# Patient Record
Sex: Female | Born: 1955 | Race: White | Hispanic: No | Marital: Married | Smoking: Never smoker
Health system: Southern US, Community
[De-identification: ages and names within clinical notes are randomized; demographics above are authoritative.]

## PROBLEM LIST (undated history)

## (undated) DIAGNOSIS — G4700 Insomnia, unspecified: Secondary | ICD-10-CM

## (undated) DIAGNOSIS — E785 Hyperlipidemia, unspecified: Secondary | ICD-10-CM

## (undated) DIAGNOSIS — K219 Gastro-esophageal reflux disease without esophagitis: Secondary | ICD-10-CM

## (undated) DIAGNOSIS — R7303 Prediabetes: Secondary | ICD-10-CM

## (undated) HISTORY — DX: Insomnia, unspecified: G47.00

## (undated) HISTORY — DX: Gastro-esophageal reflux disease without esophagitis: K21.9

## (undated) HISTORY — DX: Hyperlipidemia, unspecified: E78.5

## (undated) HISTORY — DX: Prediabetes: R73.03

---

## 2002-05-29 ENCOUNTER — Other Ambulatory Visit: Admission: RE | Admit: 2002-05-29 | Discharge: 2002-05-29 | Payer: Self-pay | Admitting: Gynecology

## 2003-08-06 ENCOUNTER — Other Ambulatory Visit: Admission: RE | Admit: 2003-08-06 | Discharge: 2003-08-06 | Payer: Self-pay | Admitting: Gynecology

## 2004-06-20 ENCOUNTER — Emergency Department (HOSPITAL_COMMUNITY): Admission: EM | Admit: 2004-06-20 | Discharge: 2004-06-20 | Payer: Self-pay | Admitting: Emergency Medicine

## 2004-08-06 ENCOUNTER — Other Ambulatory Visit: Admission: RE | Admit: 2004-08-06 | Discharge: 2004-08-06 | Payer: Self-pay | Admitting: Gynecology

## 2005-08-26 ENCOUNTER — Other Ambulatory Visit: Admission: RE | Admit: 2005-08-26 | Discharge: 2005-08-26 | Payer: Self-pay | Admitting: Gynecology

## 2007-02-08 ENCOUNTER — Ambulatory Visit: Payer: Self-pay | Admitting: Family Medicine

## 2011-10-25 ENCOUNTER — Ambulatory Visit: Payer: Self-pay | Admitting: Family Medicine

## 2014-11-08 LAB — FECAL OCCULT BLOOD, GUAIAC: FECAL OCCULT BLD: NEGATIVE

## 2016-05-03 ENCOUNTER — Ambulatory Visit: Payer: Self-pay | Admitting: Primary Care

## 2016-12-23 ENCOUNTER — Encounter: Payer: Self-pay | Admitting: Primary Care

## 2016-12-23 ENCOUNTER — Ambulatory Visit (INDEPENDENT_AMBULATORY_CARE_PROVIDER_SITE_OTHER): Payer: 59 | Admitting: Primary Care

## 2016-12-23 VITALS — BP 118/86 | HR 70 | Ht 64.5 in | Wt 150.5 lb

## 2016-12-23 DIAGNOSIS — E785 Hyperlipidemia, unspecified: Secondary | ICD-10-CM | POA: Diagnosis not present

## 2016-12-23 DIAGNOSIS — K219 Gastro-esophageal reflux disease without esophagitis: Secondary | ICD-10-CM | POA: Diagnosis not present

## 2016-12-23 DIAGNOSIS — G4700 Insomnia, unspecified: Secondary | ICD-10-CM | POA: Diagnosis not present

## 2016-12-23 MED ORDER — TRAZODONE HCL 50 MG PO TABS: ORAL_TABLET | ORAL | 0 refills | Status: DC

## 2016-12-23 NOTE — Patient Instructions (Addendum)
Try switching the omeprazole for ranitidine 150 mg. Take 1 tablet by mouth once daily. If no improvement, then you may take 150 mg twice daily.   If you experience a return of your symptoms on the ranitidine then resume the omeprazole.  Try trazodone 50 mg tablets for insomnia. Take 1-2 tablets by mouth at bedtime as needed for sleep. Please call me if you don't notice any improvement. I would like for you to stop taking Lorazepam if possible.  Complete lab work prior to leaving today. I will notify you of your results once received.   Start exercising. You should be getting 150 minutes of moderate intensity exercise weekly.  It was a pleasure to meet you today! Please don't hesitate to call me with any questions. Welcome to [facility]!   High Cholesterol High cholesterol is a condition in which the blood has high levels of a white, waxy, fat-like substance (cholesterol). The human body needs small amounts of cholesterol. The liver makes all the cholesterol that the body needs. Extra (excess) cholesterol comes from the food that we eat. Cholesterol is carried from the liver by the blood through the blood vessels. If you have high cholesterol, deposits (plaques) may build up on the walls of your blood vessels (arteries). Plaques make the arteries narrower and stiffer. Cholesterol plaques increase your risk for heart attack and stroke. Work with your health care provider to keep your cholesterol levels in a healthy range. What increases the risk? This condition is more likely to develop in people who:  Eat foods that are high in animal fat (saturated fat) or cholesterol.  Are overweight.  Are not getting enough exercise.  Have a family history of high cholesterol. What are the signs or symptoms? There are no symptoms of this condition. How is this diagnosed? This condition may be diagnosed from the results of a blood test.  If you are older than age 40, your health care provider may check  your cholesterol every 4-6 years.  You may be checked more often if you already have high cholesterol or other risk factors for heart disease. The blood test for cholesterol measures:  "Bad" cholesterol (LDL cholesterol). This is the main type of cholesterol that causes heart disease. The desired level for LDL is less than 100.  "Good" cholesterol (HDL cholesterol). This type helps to protect against heart disease by cleaning the arteries and carrying the LDL away. The desired level for HDL is 60 or higher.  Triglycerides. These are fats that the body can store or burn for energy. The desired number for triglycerides is lower than 150.  Total cholesterol. This is a measure of the total amount of cholesterol in your blood, including LDL cholesterol, HDL cholesterol, and triglycerides. A healthy number is less than 200. How is this treated? This condition is treated with diet changes, lifestyle changes, and medicines. Diet changes  This may include eating more whole grains, fruits, vegetables, nuts, and fish.  This may also include cutting back on red meat and foods that have a lot of added sugar. Lifestyle changes  Changes may include getting at least 40 minutes of aerobic exercise 3 times a week. Aerobic exercises include walking, biking, and swimming. Aerobic exercise along with a healthy diet can help you maintain a healthy weight.  Changes may also include quitting smoking. Medicines  Medicines are usually given if diet and lifestyle changes have failed to reduce your cholesterol to healthy levels.  Your health care provider may prescribe a  statin medicine. Statin medicines have been shown to reduce cholesterol, which can reduce the risk of heart disease. Follow these instructions at home: Eating and drinking If told by your health care provider:  Eat chicken (without skin), fish, veal, shellfish, ground [place]aw[place]urkey breast, and round or loin cuts of red meat.  Do not eat fried foods  or fatty meats, such as hot dogs and salami.  Eat plenty of fruits, such as apples.  Eat plenty of vegetables, such as broccoli, potatoes, and carrots.  Eat beans, peas, and lentils.  Eat grains such as barley, rice, couscous, and bulgur wheat.  Eat pasta without cream sauces.  Use skim or nonfat milk, and eat low-fat or nonfat yogurt and cheeses.  Do not eat or drink whole milk, cream, ice cream, egg yolks, or hard cheeses.  Do not eat stick margarine or tub margarines that contain trans fats (also called partially hydrogenated oils).  Do not eat saturated tropical oils, such as coconut oil and palm oil.  Do not eat cakes, cookies, crackers, or other baked goods that contain trans fats. General instructions  Exercise as directed by your health care provider. Increase your activity level with activities such as gardening, walking, and taking the stairs.  Take over-the-counter and prescription medicines only as told by your health care provider.  Do not use any products that contain nicotine or tobacco, such as cigarettes and e-cigarettes. If you need help quitting, ask your health care provider.  Keep all follow-up visits as told by your health care provider. This is important. Contact a health care provider if:  You are struggling to maintain a healthy diet or weight.  You need help to start on an exercise program.  You need help to stop smoking. Get help right away if:  You have chest pain.  You have trouble breathing. This information is not intended to replace advice given to you by your health care provider. Make sure you discuss any questions you have with your health care provider. Document Released: 10/25/2005 Document Revised: 05/22/2016 Document Reviewed: 04/24/2016 Elsevier Interactive Patient Education  2017 [employer]

## 2016-12-23 NOTE — Assessment & Plan Note (Signed)
Repeat lipids today. Discussed the importance of a healthy diet and regular exercise in order for weight loss, and to reduce the risk of other medical diseases.

## 2016-12-23 NOTE — Assessment & Plan Note (Signed)
Discussed long term effects of PPI, will have her trial ranitidine 150 mg daily, BID if no improvement. She will notify if symptoms return on H2 Blocker.

## 2016-12-23 NOTE — Progress Notes (Signed)
   Subjective:    Patient ID: Carolyn Cooke, female    DOB: Oct 25, 1956, 61 y.o.   MRN: [id number]  HPI  Ms. Carolyn Cooke is a 61 year old female who presents today to establish care and discuss the problems mentioned below. Will obtain old records.  1) Generalized Anxiety Disorder: Diagnosed several years ago. Currently managed on Fluoxetine 20 mg and lorazepam 1 mg. She takes the lorazepam 1/2 tablet at bedtime everynight to help fall asleep. She's been taking lorazepam at bedtime for the last several years. She's tried tylenol PM, Melatonin without improvement in sleep.  2) GERD: Experiences symptoms of food getting stuck in her esophagus with epigastric pressure. Currently managed on omeprazole 20 mg daily and is without symptoms. She's never tired H2 Blockers and has been on omeprazole for years.   3) Hyperlipidemia: Lipid panel in August 2017 with TC of 231, LDL of 164, Trigs of 159. This was drawn by her occupation in August 2017. Currently managed on Plant Sterols for which she's taken since her labs were drawn. She does not regularly exercise.   Review of Systems  Respiratory: Negative for shortness of breath.   Cardiovascular: Negative for chest pain.  Gastrointestinal:       GERD symptoms improved with PPI  Psychiatric/Behavioral: Positive for sleep disturbance. The patient is not nervous/anxious.        No past medical history on file.   Social History   Social History  . Marital status: Married    Spouse name: N/A  . Number of children: N/A  . Years of education: N/A   Occupational History  . Not on file.   Social History Main Topics  . Smoking status: Never Smoker  . Smokeless tobacco: Never Used  . Alcohol use Not on file  . Drug use: Unknown  . Sexual activity: Not on file   Other Topics Concern  . Not on file   Social History Narrative  . No narrative on file    No past surgical history on file.  Family History  Problem Relation Age of Onset  .  Arthritis Mother   . Hypercholesterolemia Mother   . Hypertension Mother   . Hypertension Father     No Known Allergies  No current outpatient prescriptions on file prior to visit.   No current facility-administered medications on file prior to visit.     BP 118/86 (BP Location: Left Arm, Patient Position: Sitting, Cuff Size: Normal)   Pulse 70   Ht 5' 4.5" (1.638 m)   Wt 150 lb 8 oz (68.3 kg)   SpO2 96%   BMI 25.43 kg/m    Objective:   Physical Exam  Constitutional: She appears well-nourished.  Neck: Neck supple.  Cardiovascular: Normal rate and regular rhythm.   Pulmonary/Chest: Effort normal and breath sounds normal.  Skin: Skin is warm and dry.  Psychiatric: She has a normal mood and affect.          Assessment & Plan:

## 2016-12-23 NOTE — Progress Notes (Signed)
Pre visit review using our clinic review tool, if applicable. No additional management support is needed unless otherwise documented below in the visit note. 

## 2016-12-23 NOTE — Assessment & Plan Note (Signed)
Discouraged use of Benzo's for insomnia and anxiety, she is open to discontinuing.  Continue Prozac. Will trial Trazodone for insomnia. She will update if no improvement.

## 2016-12-24 LAB — LIPID PANEL
CHOLESTEROL TOTAL: 243 mg/dL — AB (ref 100–199)
Chol/HDL Ratio: 7.4 ratio units — ABNORMAL HIGH (ref 0.0–4.4)
HDL: 33 mg/dL — ABNORMAL LOW (ref >39)
LDL Calculated: 160 mg/dL — ABNORMAL HIGH (ref 0–99)
Triglycerides: 251 mg/dL — ABNORMAL HIGH (ref 0–149)
VLDL CHOLESTEROL CAL: 50 mg/dL — AB (ref 5–40)

## 2016-12-29 ENCOUNTER — Encounter: Payer: Self-pay | Admitting: *Deleted

## 2017-04-07 LAB — HM PAP SMEAR: HM PAP: NEGATIVE

## 2018-04-13 ENCOUNTER — Encounter: Payer: Self-pay | Admitting: Primary Care

## 2018-07-12 ENCOUNTER — Ambulatory Visit: Payer: Managed Care, Other (non HMO) | Admitting: Primary Care

## 2018-07-12 ENCOUNTER — Encounter: Payer: Self-pay | Admitting: Primary Care

## 2018-07-12 DIAGNOSIS — E785 Hyperlipidemia, unspecified: Secondary | ICD-10-CM

## 2018-07-12 DIAGNOSIS — R7303 Prediabetes: Secondary | ICD-10-CM

## 2018-07-12 MED ORDER — ROSUVASTATIN CALCIUM 5 MG PO TABS: 5.0000 mg | ORAL_TABLET | Freq: Every day | ORAL | 3 refills | Status: DC

## 2018-07-12 NOTE — Progress Notes (Signed)
Subjective:    Patient ID: Carolyn Cooke, female    DOB: Sep 23, 1956, 62 y.o.   MRN: [id number]  HPI  Carolyn Cooke is a 62 year old female with a history of hyperlipidemia managed on plant sterols who presents today to discuss recent cholesterol levels.  Her last lipid panel in our office was in February 2018 with TC of 243, HDL of 33, LDl of 160, Trigs of 251. She recent underwent a health screening at work which showed TC of 254, Trigs 236, HDL of 32, LDl of 175.  She is currently taking on Cholestol OTC twice daily.   Diet currently consists of:  Breakfast: Skips, cereal  Lunch: Salad, pack of crackers Dinner: Meat, vegetable, starch Snacks: Popcorn, crackers, veggies straws Desserts: 3-5 times weekly  Beverages: Water, little soda, lemonade  Exercise: She is not exercising  The 10-year ASCVD risk score Denman George DC Jr., et al., 2013) is: 6.8%   Values used to calculate the score:     Age: 80 years     Sex: Female     Is Non-Hispanic African American: No     Diabetic: No     Tobacco smoker: No     Systolic Blood Pressure: 130 mmHg     Is BP treated: No     HDL Cholesterol: 33 mg/dL     Total Cholesterol: 243 mg/dL   Review of Systems  Respiratory: Negative for shortness of breath.   Cardiovascular: Negative for chest pain.  Neurological: Negative for dizziness and headaches.       No past medical history on file.   Social History   Socioeconomic History  . Marital status: Married    Spouse name: Not on file  . Number of children: Not on file  . Years of education: Not on file  . Highest education level: Not on file  Occupational History  . Not on file  Social Needs  . Financial resource strain: Not on file  . Food insecurity:    Worry: Not on file    Inability: Not on file  . Transportation needs:    Medical: Not on file    Non-medical: Not on file  Tobacco Use  . Smoking status: Never Smoker  . Smokeless tobacco: Never Used  Substance and Sexual  Activity  . Alcohol use: Not on file  . Drug use: Not on file  . Sexual activity: Not on file  Lifestyle  . Physical activity:    Days per week: Not on file    Minutes per session: Not on file  . Stress: Not on file  Relationships  . Social connections:    Talks on phone: Not on file    Gets together: Not on file    Attends religious service: Not on file    Active member of club or organization: Not on file    Attends meetings of clubs or organizations: Not on file    Relationship status: Not on file  . Intimate partner violence:    Fear of current or ex partner: Not on file    Emotionally abused: Not on file    Physically abused: Not on file    Forced sexual activity: Not on file  Other Topics Concern  . Not on file  Social History Narrative  . Not on file     Family History  Problem Relation Age of Onset  . Arthritis Mother   . Hypercholesterolemia Mother   . Hypertension Mother   .  Hypertension Father     Allergies  Allergen Reactions  . Cephalexin   . Morphine   . Prochlorperazine Edisylate     Current Outpatient Medications on File Prior to Visit  Medication Sig Dispense Refill  . FLUoxetine (PROZAC) 20 MG capsule Take 20 mg by mouth daily.    Marland Kitchen omeprazole (PRILOSEC) 20 MG capsule Take 20 mg by mouth daily.    . Plant Sterols and Stanols (CHOLESTOFF PO) Take by mouth.    . traZODone (DESYREL) 50 MG tablet Take 1-2 tablets by mouth at bedtime for insomnia. (Patient not taking: Reported on 07/12/2018) 30 tablet 0   No current facility-administered medications on file prior to visit.     BP 130/80   Pulse 63   Temp 98.1 F (36.7 C) (Oral)   Ht 5' 4.5" (1.638 m)   Wt 152 lb 12 oz (69.3 kg)   SpO2 98%   BMI 25.81 kg/m    Objective:   Physical Exam  Constitutional: She appears well-nourished.  Neck: Neck supple.  Cardiovascular: Normal rate and regular rhythm.  Respiratory: Effort normal and breath sounds normal.  Skin: Skin is warm and dry.            Assessment & Plan:

## 2018-07-12 NOTE — Assessment & Plan Note (Signed)
Strong family history of hyperlipidemia, CAD, stroke. She has a strong personal history hyperlipidemia that has gradually increased over time.   Long discussion regarding treatment, agree that low dose statin is appropriate given family history and gradual increase in lipids.   Rx for Crestor 5 mg. Repeat lipids and LFT's in 6 weeks

## 2018-07-12 NOTE — Assessment & Plan Note (Signed)
Recent A1C of 6.2, has been the same since 2017, A1C of 6.4 in 2016. Will have her work on lifestyle changes. Continue to monitor.

## 2018-07-12 NOTE — Patient Instructions (Signed)
Start exercising. You should be getting 150 minutes of moderate intensity exercise weekly.  Continue to work on [employer].  Ensure you are consuming 64 ounces of water daily.  Schedule a lab only appointment for 7 weeks out. Make sure to come fasting.   It was a pleasure to see you today!

## 2018-07-24 ENCOUNTER — Telehealth: Payer: Self-pay | Admitting: Primary Care

## 2018-07-24 DIAGNOSIS — E785 Hyperlipidemia, unspecified: Secondary | ICD-10-CM

## 2018-07-24 MED ORDER — ROSUVASTATIN CALCIUM 5 MG PO TABS: 5.0000 mg | ORAL_TABLET | Freq: Every day | ORAL | 0 refills | Status: DC

## 2018-07-24 NOTE — Telephone Encounter (Signed)
Spoken to patient. Will send patient 30 days supply to [facility] as requested. Will also re-sent Rx to [facility] and call as well.

## 2018-07-24 NOTE — Telephone Encounter (Signed)
Copied from CRM [id number]. Topic: Quick Communication - Rx Refill/Question >> Jul 24, 2018 12:14 PM Luanna ColeDawoud, Jessica L wrote: Medication:  rosuvastatin (CRESTOR) 5 MG tablet [[id number]]  pt has been waiting since 07/12/18. [facility] states that medication was never sent in. Pt would like this called to a local [facility]. Please advise  Has the patient contacted their pharmacy? yes Preferred Pharmacy (with phone number or street name): [facility] [address] - [place], [place] - [id number] [phone] (Phone) [phone] (Fax)   Agent: Please be advised that RX refills may take up to 3 business days. We ask that you follow-up with your pharmacy.

## 2018-07-28 MED ORDER — ROSUVASTATIN CALCIUM 5 MG PO TABS: 5.0000 mg | ORAL_TABLET | Freq: Every day | ORAL | 1 refills | Status: DC

## 2018-08-18 ENCOUNTER — Other Ambulatory Visit: Payer: Self-pay | Admitting: Primary Care

## 2018-08-18 DIAGNOSIS — E785 Hyperlipidemia, unspecified: Secondary | ICD-10-CM

## 2018-08-18 MED ORDER — ROSUVASTATIN CALCIUM 5 MG PO TABS: 5.0000 mg | ORAL_TABLET | Freq: Every day | ORAL | 1 refills | Status: DC

## 2018-08-18 NOTE — Telephone Encounter (Signed)
Lab result from her job faxed in and those results in chart.  Requested Prescriptions  Pending Prescriptions Disp Refills  . rosuvastatin (CRESTOR) 5 MG tablet 90 tablet 1    Sig: Take 1 tablet (5 mg total) by mouth daily.     Cardiovascular:  Antilipid - Statins Failed - 08/18/2018 12:36 PM      Failed - Total Cholesterol in normal range and within 360 days    Cholesterol, Total  Date Value Ref Range Status  12/23/2016 243 (H) 100 - 199 mg/dL Final         Failed - LDL in normal range and within 360 days    LDL Calculated  Date Value Ref Range Status  12/23/2016 160 (H) 0 - 99 mg/dL Final         Failed - HDL in normal range and within 360 days    HDL  Date Value Ref Range Status  12/23/2016 33 (L) >39 mg/dL Final         Failed - Triglycerides in normal range and within 360 days    Triglycerides  Date Value Ref Range Status  12/23/2016 251 (H) 0 - 149 mg/dL Final         Passed - Patient is not pregnant      Passed - Valid encounter within last 12 months    Recent Outpatient Visits          1 month ago Prediabetes   Nature conservation officer at [facility], Keane Scrape, NP   1 year ago Insomnia, unspecified type   Nature conservation officer at [facility], Keane Scrape, NP

## 2018-08-18 NOTE — Telephone Encounter (Signed)
[facility] called and spoke to [place], Pharmacy Help Desk who says the patient is not in the system.

## 2018-08-18 NOTE — Telephone Encounter (Signed)
Copied from CRM [id number]. Topic: Quick Communication - Rx Refill/Question >> Aug 18, 2018 11:07 AM Lorrine Kin, NT wrote: **Patient states that she needs a call today! States that she spoke with someone ealier this week about [facility] needing a phone call regarding the prescription. Patient states that [facility] has not received this medication. [facility] is needing a phone call [phone]).  Patient has 4 pills left. Would like a 90 day supply sent to [facility] at [facility] and [address] **  Medication: rosuvastatin (CRESTOR) 5 MG tablet   Has the patient contacted their pharmacy? Yes.   (Agent: If no, request that the patient contact the pharmacy for the refill.) (Agent: If yes, when and what did the pharmacy advise?)  Preferred Pharmacy (with phone number or street name): [id number] - [place], [place] - [address]  Agent: Please be advised that RX refills may take up to 3 business days. We ask that you follow-up with your pharmacy.

## 2018-08-29 ENCOUNTER — Telehealth: Payer: Self-pay

## 2018-08-29 DIAGNOSIS — E785 Hyperlipidemia, unspecified: Secondary | ICD-10-CM

## 2018-08-29 NOTE — Telephone Encounter (Signed)
Patient notified. Orders placed. °

## 2018-08-29 NOTE — Telephone Encounter (Signed)
Copied from CRM [id number]. Topic: General - Other >> Aug 29, 2018 10:45 AM Gaynelle Adu wrote: Reason for CRM: Patient is requesting to cancel  her lab appt,  she is wanting to have her lab order sent over to [facility]. Please advise

## 2018-08-30 ENCOUNTER — Other Ambulatory Visit: Payer: Managed Care, Other (non HMO)

## 2018-09-04 ENCOUNTER — Telehealth: Payer: Self-pay | Admitting: Primary Care

## 2018-09-04 DIAGNOSIS — E785 Hyperlipidemia, unspecified: Secondary | ICD-10-CM

## 2018-09-04 NOTE — Telephone Encounter (Signed)
Patient is due for repeat labs since starting rosuvastatin for cholesterol. Please schedule a lab only appointment. Needs to be fasting 4 hours.

## 2018-09-05 NOTE — Telephone Encounter (Signed)
Please notify patient that her labs have been sent to [facility]. Make sure she is fasting 4 hours prior to labs. Water and black coffee only.

## 2018-09-05 NOTE — Telephone Encounter (Signed)
Pt states she is wanting to do labs at [facility]

## 2018-09-05 NOTE — Telephone Encounter (Signed)
Spoke to pt

## 2018-09-07 LAB — HEPATIC FUNCTION PANEL
ALT: 15 IU/L (ref 0–32)
AST: 16 IU/L (ref 0–40)
Albumin: 4.2 g/dL (ref 3.6–4.8)
Alkaline Phosphatase: 92 IU/L (ref 39–117)
Bilirubin Total: 0.2 mg/dL (ref 0.0–1.2)
Bilirubin, Direct: 0.07 mg/dL (ref 0.00–0.40)
TOTAL PROTEIN: 6.4 g/dL (ref 6.0–8.5)

## 2018-09-07 LAB — LIPID PANEL
CHOL/HDL RATIO: 5.2 ratio — AB (ref 0.0–4.4)
Cholesterol, Total: 160 mg/dL (ref 100–199)
HDL: 31 mg/dL — AB (ref >39)
LDL CALC: 90 mg/dL (ref 0–99)
Triglycerides: 197 mg/dL — ABNORMAL HIGH (ref 0–149)
VLDL CHOLESTEROL CAL: 39 mg/dL (ref 5–40)

## 2019-02-09 ENCOUNTER — Other Ambulatory Visit: Payer: Self-pay | Admitting: Primary Care

## 2019-02-09 DIAGNOSIS — E785 Hyperlipidemia, unspecified: Secondary | ICD-10-CM

## 2019-06-09 LAB — COLOGUARD: Cologuard: NEGATIVE

## 2019-08-09 ENCOUNTER — Other Ambulatory Visit: Payer: Self-pay | Admitting: Primary Care

## 2019-08-09 DIAGNOSIS — E785 Hyperlipidemia, unspecified: Secondary | ICD-10-CM

## 2019-08-21 DIAGNOSIS — R7303 Prediabetes: Secondary | ICD-10-CM

## 2019-08-21 DIAGNOSIS — E785 Hyperlipidemia, unspecified: Secondary | ICD-10-CM

## 2019-08-21 DIAGNOSIS — Z1159 Encounter for screening for other viral diseases: Secondary | ICD-10-CM

## 2019-09-03 ENCOUNTER — Other Ambulatory Visit: Payer: Self-pay | Admitting: Primary Care

## 2019-09-04 LAB — COMPREHENSIVE METABOLIC PANEL
ALT: 13 IU/L (ref 0–32)
AST: 16 IU/L (ref 0–40)
Albumin/Globulin Ratio: 1.8 (ref 1.2–2.2)
Albumin: 4.2 g/dL (ref 3.8–4.8)
Alkaline Phosphatase: 110 IU/L (ref 39–117)
BUN/Creatinine Ratio: 15 (ref 12–28)
BUN: 12 mg/dL (ref 8–27)
Bilirubin Total: 0.3 mg/dL (ref 0.0–1.2)
CO2: 24 mmol/L (ref 20–29)
Calcium: 9.4 mg/dL (ref 8.7–10.3)
Chloride: 106 mmol/L (ref 96–106)
Creatinine, Ser: 0.8 mg/dL (ref 0.57–1.00)
GFR calc Af Amer: 90 mL/min/{1.73_m2} (ref >59)
GFR calc non Af Amer: 78 mL/min/{1.73_m2} (ref >59)
Globulin, Total: 2.3 g/dL (ref 1.5–4.5)
Glucose: 100 mg/dL — ABNORMAL HIGH (ref 65–99)
Potassium: 4.7 mmol/L (ref 3.5–5.2)
Sodium: 143 mmol/L (ref 134–144)
Total Protein: 6.5 g/dL (ref 6.0–8.5)

## 2019-09-04 LAB — LIPID PANEL W/O CHOL/HDL RATIO
Cholesterol, Total: 172 mg/dL (ref 100–199)
HDL: 36 mg/dL — ABNORMAL LOW (ref >39)
LDL Chol Calc (NIH): 107 mg/dL — ABNORMAL HIGH (ref 0–99)
Triglycerides: 161 mg/dL — ABNORMAL HIGH (ref 0–149)
VLDL Cholesterol Cal: 29 mg/dL (ref 5–40)

## 2019-09-04 LAB — CBC
Hematocrit: 38.1 % (ref 34.0–46.6)
Hemoglobin: 12.9 g/dL (ref 11.1–15.9)
MCH: 27.8 pg (ref 26.6–33.0)
MCHC: 33.9 g/dL (ref 31.5–35.7)
MCV: 82 fL (ref 79–97)
Platelets: 342 10*3/uL (ref 150–450)
RBC: 4.64 x10E6/uL (ref 3.77–5.28)
RDW: 13 % (ref 11.7–15.4)
WBC: 6.9 10*3/uL (ref 3.4–10.8)

## 2019-09-04 LAB — HEPATITIS C ANTIBODY: Hep C Virus Ab: <0.1 s/co ratio (ref 0.0–0.9)

## 2019-09-04 LAB — HEMOGLOBIN A1C
Est. average glucose Bld gHb Est-mCnc: 126 mg/dL
Hgb A1c MFr Bld: 6 % — ABNORMAL HIGH (ref 4.8–5.6)

## 2019-09-10 ENCOUNTER — Other Ambulatory Visit: Payer: Self-pay

## 2019-09-10 ENCOUNTER — Encounter: Payer: Self-pay | Admitting: Primary Care

## 2019-09-10 ENCOUNTER — Ambulatory Visit: Payer: Managed Care, Other (non HMO) | Admitting: Primary Care

## 2019-09-10 VITALS — BP 126/86 | HR 78 | Temp 97.9°F | Ht 64.5 in | Wt 154.5 lb

## 2019-09-10 DIAGNOSIS — R7303 Prediabetes: Secondary | ICD-10-CM

## 2019-09-10 DIAGNOSIS — K219 Gastro-esophageal reflux disease without esophagitis: Secondary | ICD-10-CM

## 2019-09-10 DIAGNOSIS — E785 Hyperlipidemia, unspecified: Secondary | ICD-10-CM | POA: Diagnosis not present

## 2019-09-10 DIAGNOSIS — G4700 Insomnia, unspecified: Secondary | ICD-10-CM | POA: Diagnosis not present

## 2019-09-10 DIAGNOSIS — Z23 Encounter for immunization: Secondary | ICD-10-CM | POA: Diagnosis not present

## 2019-09-10 MED ORDER — TRAZODONE HCL 50 MG PO TABS: ORAL_TABLET | ORAL | 0 refills | Status: DC

## 2019-09-10 NOTE — Patient Instructions (Addendum)
You can try the trazodone tablets as needed for sleep.   Have the pharmacy notify me when you need refills of the cholesterol medication.  Start exercising. You should be getting 150 minutes of moderate intensity exercise weekly.  Be sure to eat a healthy diet. Ensure you are consuming 64 ounces of water daily.  Schedule a nurse visit for 2-6 months from today for the second shingles vaccination.  It was a pleasure to see you today!

## 2019-09-10 NOTE — Progress Notes (Signed)
Subjective:    Patient ID: Carolyn Cooke, female    DOB: 1956/02/13, 63 y.o.   MRN: [id number]  HPI  Ms. Carolyn Cooke is a 63 year old female who presents today for follow up. She follows with her GYN for annual exams and mammograms. She would like the Shingrix vaccination.  1) Hyperlipidemia: Currently managed on rosuvastatin 5 mg for which she's compliant to daily. She admits to a poor diet and lack of regular exercise over the last year. Recent LDL of 107.  The 10-year ASCVD risk score Mikey Bussing DC Brooke Bonito., et al., 2013) is: 5.4%   Values used to calculate the score:     Age: 46 years     Sex: Female     Is Non-Hispanic African American: No     Diabetic: No     Tobacco smoker: No     Systolic Blood Pressure: 938 mmHg     Is BP treated: No     HDL Cholesterol: 36 mg/dL     Total Cholesterol: 172 mg/dL  2) Insomnia/Anxiety/Depression: Currently managed on fluoxetine 20 mg for which she taking daily for anxiety/depression. 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 She is taking Melatonin at night which does well for the most part, but does notice a few nights having difficulty falling asleep. She as once managed on Trazodone in the past and did well, would like a refill.  BP Readings from Last 3 Encounters:  09/10/19 126/86  07/12/18 130/80  12/23/16 118/86      Review of Systems  Eyes: Negative for visual disturbance.  Respiratory: Negative for shortness of breath.   Cardiovascular: Negative for chest pain.  Neurological: Negative for dizziness.  Psychiatric/Behavioral:       See HPI.       Past Medical History:  Diagnosis Date  . Hyperlipidemia   . Prediabetes      Social History   Socioeconomic History  . Marital status: Married    Spouse name: Not on file  . Number of children: Not on file  . Years of education: Not on file  . Highest education level: Not on file  Occupational History  . Not on file  Social Needs   . Financial resource strain: Not on file  . Food insecurity    Worry: Not on file    Inability: Not on file  . Transportation needs    Medical: Not on file    Non-medical: Not on file  Tobacco Use  . Smoking status: Never Smoker  . Smokeless tobacco: Never Used  Substance and Sexual Activity  . Alcohol use: Not on file  . Drug use: Not on file  . Sexual activity: Not on file  Lifestyle  . Physical activity    Days per week: Not on file    Minutes per session: Not on file  . Stress: Not on file  Relationships  . Social Herbalist on phone: Not on file    Gets together: Not on file    Attends religious service: Not on file    Active member of club or organization: Not on file    Attends meetings of clubs or organizations: Not on file    Relationship status: Not on file  . Intimate partner violence    Fear of current or ex partner: Not on file    Emotionally abused: Not on file    Physically abused: Not on  file    Forced sexual activity: Not on file  Other Topics Concern  . Not on file  Social History Narrative  . Not on file    Family History  Problem Relation Age of Onset  . Arthritis Mother   . Hypercholesterolemia Mother   . Hypertension Mother   . Hypertension Father     Allergies  Allergen Reactions  . Cephalexin   . Morphine   . Prochlorperazine Edisylate     Current Outpatient Medications on File Prior to Visit  Medication Sig Dispense Refill  . FLUoxetine (PROZAC) 20 MG capsule Take 20 mg by mouth daily.    Marland Kitchen omeprazole (PRILOSEC) 20 MG capsule Take 20 mg by mouth daily.    . Plant Sterols and Stanols (CHOLESTOFF PO) Take by mouth.    . rosuvastatin (CRESTOR) 5 MG tablet Take 1 tablet (5 mg total) by mouth daily. NEED APPOINTMENT FOR ANY MORE REFILLS 90 tablet 0   No current facility-administered medications on file prior to visit.     BP 126/86   Pulse 78   Temp 97.9 F (36.6 C) (Temporal)   Ht 5' 4.5" (1.638 m)   Wt 154 lb 8 oz  (70.1 kg)   SpO2 98%   BMI 26.11 kg/m    Objective:   Physical Exam  Constitutional: She appears well-nourished.  Neck: Neck supple.  Cardiovascular: Normal rate and regular rhythm.  Respiratory: Effort normal and breath sounds normal.  Skin: Skin is warm and dry.  Psychiatric: She has a normal mood and affect.           Assessment & Plan:

## 2019-09-10 NOTE — Assessment & Plan Note (Signed)
Doing well on fluoxetine for anxiety/depression.  Refill provided for Trazodone to use PRN as this has done well for her in the past.

## 2019-09-10 NOTE — Assessment & Plan Note (Signed)
Doing well on daily omeprazole, continue same. 

## 2019-09-10 NOTE — Assessment & Plan Note (Signed)
Recent A1C of 6.0. Encouraged healthy diet and regular exercise. Continue to monitor.

## 2019-09-10 NOTE — Assessment & Plan Note (Signed)
Lipid panel with LDL increased from last year. Continue rosuvastatin 5 mg daily, encouraged weight loss through diet and exercise.

## 2019-09-26 ENCOUNTER — Other Ambulatory Visit: Payer: Self-pay | Admitting: Primary Care

## 2019-09-26 DIAGNOSIS — G4700 Insomnia, unspecified: Secondary | ICD-10-CM

## 2019-11-13 ENCOUNTER — Other Ambulatory Visit: Payer: Self-pay

## 2019-11-13 DIAGNOSIS — E785 Hyperlipidemia, unspecified: Secondary | ICD-10-CM

## 2019-11-13 MED ORDER — ROSUVASTATIN CALCIUM 5 MG PO TABS: 5.0000 mg | ORAL_TABLET | Freq: Every day | ORAL | 1 refills | Status: DC

## 2020-05-09 ENCOUNTER — Other Ambulatory Visit: Payer: Self-pay | Admitting: Primary Care

## 2020-05-09 DIAGNOSIS — E785 Hyperlipidemia, unspecified: Secondary | ICD-10-CM

## 2020-08-19 LAB — HM PAP SMEAR: HPV, high-risk: NEGATIVE

## 2020-10-22 ENCOUNTER — Telehealth (INDEPENDENT_AMBULATORY_CARE_PROVIDER_SITE_OTHER): Payer: Managed Care, Other (non HMO) | Admitting: Primary Care

## 2020-10-22 ENCOUNTER — Other Ambulatory Visit: Payer: Self-pay

## 2020-10-22 ENCOUNTER — Encounter: Payer: Self-pay | Admitting: Primary Care

## 2020-10-22 DIAGNOSIS — U071 COVID-19: Secondary | ICD-10-CM

## 2020-10-22 HISTORY — DX: COVID-19: U07.1

## 2020-10-22 NOTE — Patient Instructions (Signed)
Continue Flonase nasal spray as discussed.  Start Zyrtec at bedtime as discussed.  Continue Melatonin at bedtime for sleep.  It was a pleasure to see you today! Mayra Reel, NP-C

## 2020-10-22 NOTE — Assessment & Plan Note (Signed)
Post-Covid symptoms for the last few weeks. No suspicious symptoms, she sounds very well on the phone. No distress.  Discussed that these symptoms may linger for days, weeks, or months. Discussed warning signs of high fevers, pain with deep inspiration, cough, worsening symptoms.  Continue Flonase, add in Zyrtec HS. Discussed use of Melatonin at night.  She will update if symptoms persist.

## 2020-10-22 NOTE — Progress Notes (Signed)
Subjective:    Patient ID: Carolyn Cooke, female    DOB: [birth year], 64 y.o.   MRN: [id number]  HPI  Virtual Visit via Video Note  I connected with Carolyn Cooke on 10/22/20 at 12:00 PM EST by a video enabled telemedicine application and verified that I am speaking with the correct person using two identifiers.  Location: Patient: Home Provider: Office Participants: Patient and myself   I discussed the limitations of evaluation and management by telemedicine and the availability of in person appointments. The patient expressed understanding and agreed to proceed.  We attempted to connect via phone but she was unable to get her video to work. We conducted her visit via phone which lasted 18 min and 43 sec.  History of Present Illness:  Carolyn Cooke is a 64 year old female with a history of prediabetes, insomnia, hyperlipidemia, Covid-19 infection who presents today to discuss post Covid-19 symptoms.  She contracted Covid-26 September 2020 (around Thanksgiving), tested positive on November 28 th, she is not vaccinated against Covid-19.  Symptoms include residual nasal congestion, intermittent dental pain in area of prior root canals, fatigue, body aches to thoracic back around bra strap (improved with rest), "brain fog".   She's been using Day and Night Alka Seltzer Plus without improvement. She's been Flonase with some improvement, no recent use of Claritin.  She doesn't like Trazodone, makes her feel bad the next day. She's been taking Tylenol PM or Melatonin for sleep with improvement.    Observations/Objective:  Alert and oriented. Appears well, not sickly. No distress. Speaking in complete sentences. No cough during visit.  Assessment and Plan:  Post-Covid symptoms for the last few weeks. No suspicious symptoms, she sounds very well on the phone. No distress.  Discussed that these symptoms may linger for days, weeks, or months. Discussed warning signs of  high fevers, pain with deep inspiration, cough, worsening symptoms.  Continue Flonase, add in Zyrtec HS. Discussed use of Melatonin at night.  She will update if symptoms persist.   Follow Up Instructions:  Continue Flonase nasal spray as discussed.  Start Zyrtec at bedtime as discussed.  Continue Melatonin at bedtime for sleep.  It was a pleasure to see you today! Mayra Reel, NP-C    I discussed the assessment and treatment plan with the patient. The patient was provided an opportunity to ask questions and all were answered. The patient agreed with the plan and demonstrated an understanding of the instructions.   The patient was advised to call back or seek an in-person evaluation if the symptoms worsen or if the condition fails to improve as anticipated.    Doreene Nest, NP    Review of Systems  Constitutional: Positive for fatigue. Negative for fever.  HENT: Positive for congestion. Negative for postnasal drip, sinus pressure and sore throat.   Respiratory: Negative for cough and shortness of breath.   Cardiovascular: Negative for chest pain.  Gastrointestinal: Negative for diarrhea.  Allergic/Immunologic: Positive for environmental allergies.       Past Medical History:  Diagnosis Date  . GERD (gastroesophageal reflux disease)   . Hyperlipidemia   . Insomnia   . Prediabetes      Social History   Socioeconomic History  . Marital status: Married    Spouse name: Not on file  . Number of children: Not on file  . Years of education: Not on file  . Highest education level: Not on file  Occupational History  . Not on  file  Tobacco Use  . Smoking status: Never Smoker  . Smokeless tobacco: Never Used  Substance and Sexual Activity  . Alcohol use: Not on file  . Drug use: Not on file  . Sexual activity: Not on file  Other Topics Concern  . Not on file  Social History Narrative  . Not on file   Social Determinants of Health   Financial Resource  Strain: Not on file  Food Insecurity: Not on file  Transportation Needs: Not on file  Physical Activity: Not on file  Stress: Not on file  Social Connections: Not on file  Intimate Partner Violence: Not on file    History reviewed. No pertinent surgical history.  Family History  Problem Relation Age of Onset  . Arthritis Mother   . Hypercholesterolemia Mother   . Hypertension Mother   . Hypertension Father     Allergies  Allergen Reactions  . Cephalexin   . Morphine   . Prochlorperazine Edisylate     Current Outpatient Medications on File Prior to Visit  Medication Sig Dispense Refill  . FLUoxetine (PROZAC) 20 MG capsule Take 20 mg by mouth daily.    . meloxicam (MOBIC) 15 MG tablet meloxicam 15 mg tablet    . omeprazole (PRILOSEC) 20 MG capsule Take 20 mg by mouth daily.    . rosuvastatin (CRESTOR) 5 MG tablet TAKE 1 TABLET(5 MG) BY MOUTH DAILY 90 tablet 1   No current facility-administered medications on file prior to visit.    Ht 5' 4.5" (1.638 m)   Wt 154 lb (69.9 kg)   BMI 26.03 kg/m    Objective:   Physical Exam Constitutional:      General: She is not in acute distress. Pulmonary:     Effort: Pulmonary effort is normal.     Comments: No cough during visit  Neurological:     Mental Status: She is alert and oriented to person, place, and time.  Psychiatric:        Mood and Affect: Mood normal.            Assessment & Plan:

## 2020-11-07 ENCOUNTER — Other Ambulatory Visit: Payer: Self-pay | Admitting: Primary Care

## 2020-11-07 DIAGNOSIS — E785 Hyperlipidemia, unspecified: Secondary | ICD-10-CM

## 2021-05-09 ENCOUNTER — Telehealth: Payer: Self-pay | Admitting: Primary Care

## 2021-05-09 DIAGNOSIS — E785 Hyperlipidemia, unspecified: Secondary | ICD-10-CM

## 2021-05-09 NOTE — Telephone Encounter (Signed)
Patient has not been seen since October of 2020, needs office visit. Refill request denied.

## 2021-05-12 ENCOUNTER — Other Ambulatory Visit: Payer: Self-pay | Admitting: Primary Care

## 2021-05-12 DIAGNOSIS — E785 Hyperlipidemia, unspecified: Secondary | ICD-10-CM

## 2021-05-13 MED ORDER — ROSUVASTATIN CALCIUM 5 MG PO TABS: ORAL_TABLET | ORAL | 0 refills | Status: DC

## 2021-05-13 NOTE — Telephone Encounter (Signed)
Office visit required for further refills.  Provided 30 day supply.

## 2021-05-13 NOTE — Telephone Encounter (Signed)
  LAST APPOINTMENT DATE: Visit date not found   NEXT APPOINTMENT DATE:@7 /03/2021  MEDICATION: rosuvastatin  PHARMACY: [facility]- [address] AT [address]  Let patient know to contact pharmacy at the end of the day to make sure medication is ready.  Please notify patient to allow 48-72 hours to process  Encourage patient to contact the pharmacy for refills or they can request refills through [facility]  CLINICAL FILLS OUT ALL BELOW:   LAST REFILL:  QTY:  REFILL DATE:    OTHER COMMENTS:    Okay for refill?  Please advise

## 2021-06-02 ENCOUNTER — Ambulatory Visit (INDEPENDENT_AMBULATORY_CARE_PROVIDER_SITE_OTHER): Payer: Managed Care, Other (non HMO) | Admitting: Primary Care

## 2021-06-02 ENCOUNTER — Other Ambulatory Visit: Payer: Self-pay

## 2021-06-02 ENCOUNTER — Encounter: Payer: Self-pay | Admitting: Primary Care

## 2021-06-02 VITALS — BP 118/72 | HR 84 | Temp 97.4°F | Ht 64.5 in | Wt 155.0 lb

## 2021-06-02 DIAGNOSIS — F32A Depression, unspecified: Secondary | ICD-10-CM | POA: Insufficient documentation

## 2021-06-02 DIAGNOSIS — R7303 Prediabetes: Secondary | ICD-10-CM | POA: Diagnosis not present

## 2021-06-02 DIAGNOSIS — Z1152 Encounter for screening for COVID-19: Secondary | ICD-10-CM

## 2021-06-02 DIAGNOSIS — K219 Gastro-esophageal reflux disease without esophagitis: Secondary | ICD-10-CM

## 2021-06-02 DIAGNOSIS — F419 Anxiety disorder, unspecified: Secondary | ICD-10-CM

## 2021-06-02 DIAGNOSIS — Z0000 Encounter for general adult medical examination without abnormal findings: Secondary | ICD-10-CM | POA: Insufficient documentation

## 2021-06-02 DIAGNOSIS — E785 Hyperlipidemia, unspecified: Secondary | ICD-10-CM

## 2021-06-02 NOTE — Progress Notes (Signed)
Subjective:    Patient ID: Carolyn Cooke, female    DOB: [birth year], 65 y.o.   MRN: [id number]  HPI  Elon Lomeli is a very pleasant 65 y.o. female with a history of insomnia, hyperlipidemia, prediabetes, Covid-19 infection, GERD who presents today for follow up of chronic conditions, medication refill, and for complete physical.   Immunizations: -Tetanus: Unsure -Influenza: Does not complete -Covid-19: Has not completed  -Shingles: Completed 1 dose -Pneumonia: Never completed, declines    Diet: Fair diet.  Exercise: No regular exercise.  Eye exam: Completes annually  Dental exam: Completes semi-annually   Mammogram: Completed in October 2021 per GYN Dexa: Completed in 2021 Colonoscopy: Completed Cologuard in 2020   BP Readings from Last 3 Encounters:  06/02/21 118/72  09/10/19 126/86  07/12/18 130/80      Review of Systems  Constitutional:  Negative for unexpected weight change.  HENT:  Negative for rhinorrhea.   Respiratory:  Negative for shortness of breath.   Cardiovascular:  Negative for chest pain.  Gastrointestinal:  Negative for constipation and diarrhea.  Genitourinary:  Negative for difficulty urinating.  Musculoskeletal:  Negative for arthralgias and myalgias.  Skin:  Negative for rash.  Allergic/Immunologic: Negative for environmental allergies.  Neurological:  Negative for dizziness and headaches.  Psychiatric/Behavioral:  The patient is nervous/anxious.         Past Medical History:  Diagnosis Date   GERD (gastroesophageal reflux disease)    Hyperlipidemia    Insomnia    Prediabetes     Social History   Socioeconomic History   Marital status: Married    Spouse name: Not on file   Number of children: Not on file   Years of education: Not on file   Highest education level: Not on file  Occupational History   Not on file  Tobacco Use   Smoking status: Never   Smokeless tobacco: Never  Substance and Sexual Activity    Alcohol use: Not on file   Drug use: Not on file   Sexual activity: Not on file  Other Topics Concern   Not on file  Social History Narrative   Not on file   Social Determinants of Health   Financial Resource Strain: Not on file  Food Insecurity: Not on file  Transportation Needs: Not on file  Physical Activity: Not on file  Stress: Not on file  Social Connections: Not on file  Intimate Partner Violence: Not on file    No past surgical history on file.  Family History  Problem Relation Age of Onset   Arthritis Mother    Hypercholesterolemia Mother    Hypertension Mother    Hypertension Father     Allergies  Allergen Reactions   Cephalexin    Morphine    Prochlorperazine Edisylate     Current Outpatient Medications on File Prior to Visit  Medication Sig Dispense Refill   FLUoxetine (PROZAC) 20 MG capsule Take 20 mg by mouth daily.     omeprazole (PRILOSEC) 20 MG capsule Take 20 mg by mouth daily.     rosuvastatin (CRESTOR) 5 MG tablet TAKE 1 TABLET(5 MG) BY MOUTH DAILY for cholesterol. 30 tablet 0   No current facility-administered medications on file prior to visit.    BP 118/72 (BP Location: Left Arm, Patient Position: Sitting, Cuff Size: Normal)   Pulse 84   Temp (!) 97.4 F (36.3 C)   Ht 5' 4.5" (1.638 m)   Wt 155 lb (70.3 kg)   BMI  26.19 kg/m  Objective:   Physical Exam HENT:     Right Ear: Tympanic membrane and ear canal normal.     Left Ear: Tympanic membrane and ear canal normal.     Nose: Nose normal.  Eyes:     Conjunctiva/sclera: Conjunctivae normal.     Pupils: Pupils are equal, round, and reactive to light.  Neck:     Thyroid: No thyromegaly.  Cardiovascular:     Rate and Rhythm: Normal rate and regular rhythm.     Heart sounds: No murmur heard. Pulmonary:     Effort: Pulmonary effort is normal.     Breath sounds: Normal breath sounds. No rales.  Abdominal:     General: Bowel sounds are normal.     Palpations: Abdomen is soft.      Tenderness: There is no abdominal tenderness.  Musculoskeletal:        General: Normal range of motion.     Cervical back: Neck supple.  Lymphadenopathy:     Cervical: No cervical adenopathy.  Skin:    General: Skin is warm and dry.     Findings: No rash.  Neurological:     Mental Status: She is alert and oriented to person, place, and time.     Cranial Nerves: No cranial nerve deficit.     Deep Tendon Reflexes: Reflexes are normal and symmetric.  Psychiatric:        Mood and Affect: Mood normal.          Assessment & Plan:      This visit occurred during the SARS-CoV-2 public health emergency.  Safety protocols were in place, including screening questions prior to the visit, additional usage of staff PPE, and extensive cleaning of exam room while observing appropriate contact time as indicated for disinfecting solutions.

## 2021-06-02 NOTE — Assessment & Plan Note (Signed)
Doing well on omeprazole 20 mg for which she takes daily. Continue same.

## 2021-06-02 NOTE — Assessment & Plan Note (Signed)
Discussed the importance of a healthy diet and regular exercise in order for weight loss, and to reduce the risk of further co-morbidity. ? ?Repeat A1C pending. ?

## 2021-06-02 NOTE — Assessment & Plan Note (Addendum)
Doing well on fluoxetine 20 mg which is prescribed by her GYN. I offered to take over. She is considering a dose increase but is not ready at this time. She will notify.   Continue fluoxetine 20 mg.

## 2021-06-02 NOTE — Assessment & Plan Note (Signed)
Compliant to rosuvastatin 5 mg, continue same.  Lipid panel pending.

## 2021-06-02 NOTE — Patient Instructions (Signed)
Stop by the lab prior to leaving today. I will notify you of your results once received.   It was a pleasure to see you today!  Preventive Care 66 Years and Older, Female Preventive care refers to lifestyle choices and visits with your health care provider that can promote health and wellness. This includes: A yearly physical exam. This is also called an annual wellness visit. Regular dental and eye exams. Immunizations. Screening for certain conditions. Healthy lifestyle choices, such as: Eating a healthy diet. Getting regular exercise. Not using drugs or products that contain nicotine and tobacco. Limiting alcohol use. What can I expect for my preventive care visit? Physical exam Your health care provider will check your: Height and weight. These may be used to calculate your BMI (body mass index). BMI is a measurement that tells if you are at a healthy weight. Heart rate and blood pressure. Body temperature. Skin for abnormal spots. Counseling Your health care provider may ask you questions about your: Past medical problems. Family's medical history. Alcohol, tobacco, and drug use. Emotional well-being. Home life and relationship well-being. Sexual activity. Diet, exercise, and sleep habits. History of falls. Memory and ability to understand (cognition). Work and work environment. Pregnancy and menstrual history. Access to firearms. What immunizations do I need?  Vaccines are usually given at various ages, according to a schedule. Your health care provider will recommend vaccines for you based on your age, medicalhistory, and lifestyle or other factors, such as travel or where you work. What tests do I need? Blood tests Lipid and cholesterol levels. These may be checked every 5 years, or more often depending on your overall health. Hepatitis C test. Hepatitis B test. Screening Lung cancer screening. You may have this screening every year starting at age 55 if you have  a 30-pack-year history of smoking and currently smoke or have quit within the past 15 years. Colorectal cancer screening. All adults should have this screening starting at age 50 and continuing until age 75. Your health care provider may recommend screening at age 45 if you are at increased risk. You will have tests every 1-10 years, depending on your results and the type of screening test. Diabetes screening. This is done by checking your blood sugar (glucose) after you have not eaten for a while (fasting). You may have this done every 1-3 years. Mammogram. This may be done every 1-2 years. Talk with your health care provider about how often you should have regular mammograms. Abdominal aortic aneurysm (AAA) screening. You may need this if you are a current or former smoker. BRCA-related cancer screening. This may be done if you have a family history of breast, ovarian, tubal, or peritoneal cancers. Other tests STD (sexually transmitted disease) testing, if you are at risk. Bone density scan. This is done to screen for osteoporosis. You may have this done starting at age 65. Talk with your health care provider about your test results, treatment options,and if necessary, the need for more tests. Follow these instructions at home: Eating and drinking  Eat a diet that includes fresh fruits and vegetables, whole grains, lean protein, and low-fat dairy products. Limit your intake of foods with high amounts of sugar, saturated fats, and salt. Take vitamin and mineral supplements as recommended by your health care provider. Do not drink alcohol if your health care provider tells you not to drink. If you drink alcohol: Limit how much you have to 0-1 drink a day. Be aware of how much alcohol is   in your drink. In the [place] one drink equals one 12 oz bottle of beer (355 mL), one 5 oz glass of wine (148 mL), or one 1 oz glass of hard liquor (44 mL).  Lifestyle Take daily care of your teeth and  gums. Brush your teeth every morning and night with fluoride toothpaste. Floss one time each day. Stay active. Exercise for at least 30 minutes 5 or more days each week. Do not use any products that contain nicotine or tobacco, such as cigarettes, e-cigarettes, and chewing tobacco. If you need help quitting, ask your health care provider. Do not use drugs. If you are sexually active, practice safe sex. Use a condom or other form of protection in order to prevent STIs (sexually transmitted infections). Talk with your health care provider about taking a low-dose aspirin or statin. Find healthy ways to cope with stress, such as: Meditation, yoga, or listening to music. Journaling. Talking to a trusted person. Spending time with friends and family. Safety Always wear your seat belt while driving or riding in a vehicle. Do not drive: If you have been drinking alcohol. Do not ride with someone who has been drinking. When you are tired or distracted. While texting. Wear a helmet and other protective equipment during sports activities. If you have firearms in your house, make sure you follow all gun safety procedures. What's next? Visit your health care provider once a year for an annual wellness visit. Ask your health care provider how often you should have your eyes and teeth checked. Stay up to date on all vaccines. This information is not intended to replace advice given to you by your health care provider. Make sure you discuss any questions you have with your healthcare provider. Document Revised: 10/15/2020 Document Reviewed: 10/19/2018 Elsevier Patient Education  2022 Elsevier Inc.  

## 2021-06-02 NOTE — Assessment & Plan Note (Signed)
Declines second shingrix dose, also declines pneumonia vaccines.  Mammogram and bone density scans UTD, follows with GYN. Colon cancer screening UTD, due for repeat Cologuard in 2023.  Discussed the importance of a healthy diet and regular exercise in order for weight loss, and to reduce the risk of further co-morbidity.  Exam today stable. Labs pending.

## 2021-06-03 LAB — HEMOGLOBIN A1C
Est. average glucose Bld gHb Est-mCnc: 134 mg/dL
Hgb A1c MFr Bld: 6.3 % — ABNORMAL HIGH (ref 4.8–5.6)

## 2021-06-03 LAB — COMPREHENSIVE METABOLIC PANEL
ALT: 13 IU/L (ref 0–32)
AST: 16 IU/L (ref 0–40)
Albumin/Globulin Ratio: 2 (ref 1.2–2.2)
Albumin: 4.5 g/dL (ref 3.8–4.8)
Alkaline Phosphatase: 142 IU/L — ABNORMAL HIGH (ref 44–121)
BUN/Creatinine Ratio: 14 (ref 12–28)
BUN: 10 mg/dL (ref 8–27)
Bilirubin Total: 0.2 mg/dL (ref 0.0–1.2)
CO2: 25 mmol/L (ref 20–29)
Calcium: 9.6 mg/dL (ref 8.7–10.3)
Chloride: 100 mmol/L (ref 96–106)
Creatinine, Ser: 0.71 mg/dL (ref 0.57–1.00)
Globulin, Total: 2.3 g/dL (ref 1.5–4.5)
Glucose: 122 mg/dL — ABNORMAL HIGH (ref 65–99)
Potassium: 4.6 mmol/L (ref 3.5–5.2)
Sodium: 141 mmol/L (ref 134–144)
Total Protein: 6.8 g/dL (ref 6.0–8.5)
eGFR: 94 mL/min/{1.73_m2} (ref >59)

## 2021-06-03 LAB — SARS-COV-2 SEMI-QUANTITATIVE TOTAL ANTIBODY, SPIKE: SARS-CoV-2 Spike Ab Interp: POSITIVE

## 2021-06-03 LAB — CBC
Hematocrit: 40.7 % (ref 34.0–46.6)
Hemoglobin: 13.4 g/dL (ref 11.1–15.9)
MCH: 26.9 pg (ref 26.6–33.0)
MCHC: 32.9 g/dL (ref 31.5–35.7)
MCV: 82 fL (ref 79–97)
Platelets: 386 10*3/uL (ref 150–450)
RBC: 4.99 x10E6/uL (ref 3.77–5.28)
RDW: 12.6 % (ref 11.7–15.4)
WBC: 7.8 10*3/uL (ref 3.4–10.8)

## 2021-06-03 LAB — LIPID PANEL
Chol/HDL Ratio: 5.5 ratio — ABNORMAL HIGH (ref 0.0–4.4)
Cholesterol, Total: 180 mg/dL (ref 100–199)
HDL: 33 mg/dL — ABNORMAL LOW (ref >39)
LDL Chol Calc (NIH): 90 mg/dL (ref 0–99)
Triglycerides: 345 mg/dL — ABNORMAL HIGH (ref 0–149)
VLDL Cholesterol Cal: 57 mg/dL — ABNORMAL HIGH (ref 5–40)

## 2021-06-03 LAB — SARS-COV-2 SPIKE AB DILUTION: SARS-CoV-2 Spike Ab Dilution: 815 U/mL (ref <0.8)

## 2021-06-04 ENCOUNTER — Other Ambulatory Visit: Payer: Self-pay

## 2021-06-04 DIAGNOSIS — E785 Hyperlipidemia, unspecified: Secondary | ICD-10-CM

## 2021-06-05 MED ORDER — ROSUVASTATIN CALCIUM 5 MG PO TABS: ORAL_TABLET | ORAL | 3 refills | Status: DC

## 2021-09-14 ENCOUNTER — Other Ambulatory Visit: Payer: Self-pay | Admitting: Obstetrics

## 2021-09-14 DIAGNOSIS — N6452 Nipple discharge: Secondary | ICD-10-CM

## 2021-10-06 DIAGNOSIS — F32A Depression, unspecified: Secondary | ICD-10-CM

## 2021-10-06 MED ORDER — FLUOXETINE HCL 20 MG PO CAPS: 20.0000 mg | ORAL_CAPSULE | Freq: Every day | ORAL | 1 refills | Status: DC

## 2021-10-21 ENCOUNTER — Ambulatory Visit
Admission: RE | Admit: 2021-10-21 | Discharge: 2021-10-21 | Disposition: A | Discharge status: Home / Self Care | Payer: Managed Care, Other (non HMO) | Source: Ambulatory Visit | Attending: Obstetrics | Admitting: Obstetrics

## 2021-10-21 DIAGNOSIS — N6452 Nipple discharge: Secondary | ICD-10-CM

## 2021-11-16 ENCOUNTER — Other Ambulatory Visit: Payer: Self-pay | Admitting: Obstetrics

## 2021-11-16 DIAGNOSIS — N6452 Nipple discharge: Secondary | ICD-10-CM

## 2021-12-02 ENCOUNTER — Ambulatory Visit
Admission: RE | Admit: 2021-12-02 | Discharge: 2021-12-02 | Disposition: A | Discharge status: Home / Self Care | Payer: Managed Care, Other (non HMO) | Source: Ambulatory Visit | Attending: Obstetrics | Admitting: Obstetrics

## 2021-12-02 ENCOUNTER — Other Ambulatory Visit: Payer: Self-pay

## 2021-12-02 DIAGNOSIS — N6452 Nipple discharge: Secondary | ICD-10-CM

## 2021-12-02 MED ORDER — GADOBUTROL 1 MMOL/ML IV SOLN
7.0000 mL | Freq: Once | INTRAVENOUS | Status: AC | PRN
  Administered 2021-12-02: 7 mL via INTRAVENOUS

## 2022-04-02 ENCOUNTER — Other Ambulatory Visit: Payer: Self-pay | Admitting: Primary Care

## 2022-04-02 DIAGNOSIS — F419 Anxiety disorder, unspecified: Secondary | ICD-10-CM

## 2022-05-28 ENCOUNTER — Other Ambulatory Visit: Payer: Self-pay | Admitting: Primary Care

## 2022-05-28 DIAGNOSIS — E785 Hyperlipidemia, unspecified: Secondary | ICD-10-CM

## 2022-06-04 ENCOUNTER — Telehealth: Payer: Self-pay | Admitting: Primary Care

## 2022-06-04 DIAGNOSIS — E785 Hyperlipidemia, unspecified: Secondary | ICD-10-CM

## 2022-06-04 NOTE — Telephone Encounter (Signed)
Patient scheduled a physical and labs on 06/23/2022 and also wanted a refill for rosuvastatin (CRESTOR) 5 MG tablet because she said she will be out by the time for the visit. Call back number [phone].

## 2022-06-07 NOTE — Telephone Encounter (Signed)
Is this okay to refill? 

## 2022-06-08 DIAGNOSIS — E785 Hyperlipidemia, unspecified: Secondary | ICD-10-CM

## 2022-06-08 NOTE — Telephone Encounter (Signed)
The 30 day supply was sent to [facility] on 05/28/22. Have her call [facility]. They should have this on file.

## 2022-06-08 NOTE — Telephone Encounter (Signed)
Called patient she did not get refill. She states it may be because it has to be 90 day to get from mail order. She needs the 30 day sent to [facility]

## 2022-06-08 NOTE — Telephone Encounter (Signed)
Noted. It looks like I sent a 30 day supply on 05/28/22 to her mail order pharmacy. Did she receive this order?

## 2022-06-09 MED ORDER — ROSUVASTATIN CALCIUM 5 MG PO TABS: ORAL_TABLET | ORAL | 0 refills | Status: DC

## 2022-06-09 NOTE — Telephone Encounter (Signed)
Left message to return call to our office.  

## 2022-06-15 NOTE — Telephone Encounter (Signed)
Called patient she was able to get medication

## 2022-06-17 LAB — BASIC METABOLIC PANEL
Creatinine: 0.8 (ref 0.5–1.1)
Glucose: 99

## 2022-06-17 LAB — LIPID PANEL
Cholesterol: 173 (ref 0–200)
HDL: 37 (ref 35–70)
LDL Cholesterol: 109
Triglycerides: 153 (ref 40–160)

## 2022-06-17 LAB — HEMOGLOBIN A1C: Hemoglobin A1C: 6.3

## 2022-06-17 LAB — COMPREHENSIVE METABOLIC PANEL: eGFR: 87

## 2022-06-23 ENCOUNTER — Encounter: Payer: Self-pay | Admitting: Primary Care

## 2022-06-23 ENCOUNTER — Ambulatory Visit (INDEPENDENT_AMBULATORY_CARE_PROVIDER_SITE_OTHER): Payer: Managed Care, Other (non HMO) | Admitting: Primary Care

## 2022-06-23 VITALS — BP 120/82 | HR 73 | Temp 98.5°F | Ht 64.5 in | Wt 156.0 lb

## 2022-06-23 DIAGNOSIS — F419 Anxiety disorder, unspecified: Secondary | ICD-10-CM

## 2022-06-23 DIAGNOSIS — K219 Gastro-esophageal reflux disease without esophagitis: Secondary | ICD-10-CM

## 2022-06-23 DIAGNOSIS — G4700 Insomnia, unspecified: Secondary | ICD-10-CM

## 2022-06-23 DIAGNOSIS — Z0000 Encounter for general adult medical examination without abnormal findings: Secondary | ICD-10-CM | POA: Diagnosis not present

## 2022-06-23 DIAGNOSIS — Z1211 Encounter for screening for malignant neoplasm of colon: Secondary | ICD-10-CM

## 2022-06-23 DIAGNOSIS — R7303 Prediabetes: Secondary | ICD-10-CM

## 2022-06-23 DIAGNOSIS — E785 Hyperlipidemia, unspecified: Secondary | ICD-10-CM | POA: Diagnosis not present

## 2022-06-23 DIAGNOSIS — F32A Depression, unspecified: Secondary | ICD-10-CM

## 2022-06-23 NOTE — Assessment & Plan Note (Signed)
Controlled.   Continue omeprazole 20 mg daily. 

## 2022-06-23 NOTE — Assessment & Plan Note (Signed)
Reviewed lipid panel from her employer from August 2023. Continue rosuvastatin 5 mg daily.   Discussed the importance of a healthy diet and regular exercise in order for weight loss, and to reduce the risk of further co-morbidity.

## 2022-06-23 NOTE — Assessment & Plan Note (Signed)
Controlled.  Continue fluoxetine 20 mg daily. Refills provided.

## 2022-06-23 NOTE — Assessment & Plan Note (Addendum)
Overall controlled.  Continue fluoxetine 20 mg daily. Continue melatonin 10 mg HS. Continue Trazodone 50 mg HS PRN. Continue to monitor.

## 2022-06-23 NOTE — Assessment & Plan Note (Signed)
Reviewed A1C from labs from employer in August 2023.  Discussed the importance of a healthy diet and regular exercise in order for weight loss, and to reduce the risk of further co-morbidity.  Continue to monitor.

## 2022-06-23 NOTE — Progress Notes (Signed)
Subjective:    Patient ID: Carolyn Cooke, female    DOB: [birth year], 66 y.o.   MRN: [id number]  HPI  Carolyn Cooke is a very pleasant 66 y.o. female who presents today for complete physical and follow up of chronic conditions.  Immunizations: -Influenza: Did not complete last season  -Covid-19: Has not completed -Shingles: Completed 1 dose in 2020, declines  -Pneumonia: Never completed, declines   Diet: Fair diet.  Exercise: No regular exercise.  Eye exam: Completes annually  Dental exam: Completes semi-annually   Mammogram: Completed in January 2023  Colonoscopy: Completed Cologuard in September 2020 Dexa: Completed in 2021    BP Readings from Last 3 Encounters:  06/23/22 120/82  06/02/21 118/72  09/10/19 126/86       Review of Systems  Constitutional:  Negative for unexpected weight change.  HENT:  Negative for rhinorrhea.   Respiratory:  Negative for cough and shortness of breath.   Cardiovascular:  Negative for chest pain.  Gastrointestinal:  Negative for constipation and diarrhea.  Genitourinary:  Negative for difficulty urinating.  Musculoskeletal:  Positive for arthralgias.  Skin:  Negative for rash.  Allergic/Immunologic: Negative for environmental allergies.  Neurological:  Negative for dizziness and headaches.  Psychiatric/Behavioral:  The patient is nervous/anxious.          Past Medical History:  Diagnosis Date   COVID-19 virus infection 10/22/2020   GERD (gastroesophageal reflux disease)    Hyperlipidemia    Insomnia    Prediabetes     Social History   Socioeconomic History   Marital status: Married    Spouse name: Not on file   Number of children: Not on file   Years of education: Not on file   Highest education level: Not on file  Occupational History   Not on file  Tobacco Use   Smoking status: Never   Smokeless tobacco: Never  Substance and Sexual Activity   Alcohol use: Not on file   Drug use: Not on file    Sexual activity: Not on file  Other Topics Concern   Not on file  Social History Narrative   Not on file   Social Determinants of Health   Financial Resource Strain: Not on file  Food Insecurity: Not on file  Transportation Needs: Not on file  Physical Activity: Not on file  Stress: Not on file  Social Connections: Not on file  Intimate Partner Violence: Not on file    History reviewed. No pertinent surgical history.  Family History  Problem Relation Age of Onset   Arthritis Mother    Hypercholesterolemia Mother    Hypertension Mother    Hypertension Father     Allergies  Allergen Reactions   Cephalexin    Morphine    Prochlorperazine Edisylate     Current Outpatient Medications on File Prior to Visit  Medication Sig Dispense Refill   FLUoxetine (PROZAC) 20 MG capsule Take 1 capsule (20 mg total) by mouth daily. for anxiety and depression. Office visit required in July for further refills. 90 capsule 0   omeprazole (PRILOSEC) 20 MG capsule Take 20 mg by mouth daily.     rosuvastatin (CRESTOR) 5 MG tablet TAKE 1 TABLET(5 MG) BY MOUTH DAILY for cholesterol. Office visit required for further refills. 90 tablet 0   traZODone (DESYREL) 50 MG tablet Take 50 mg by mouth at bedtime as needed.     No current facility-administered medications on file prior to visit.    BP 120/82  Pulse 73   Temp 98.5 F (36.9 C) (Oral)   Ht 5' 4.5" (1.638 m)   Wt 156 lb (70.8 kg)   SpO2 97%   BMI 26.36 kg/m  Objective:   Physical Exam HENT:     Right Ear: Tympanic membrane and ear canal normal.     Left Ear: Tympanic membrane and ear canal normal.     Nose: Nose normal.  Eyes:     Conjunctiva/sclera: Conjunctivae normal.     Pupils: Pupils are equal, round, and reactive to light.  Neck:     Thyroid: No thyromegaly.  Cardiovascular:     Rate and Rhythm: Normal rate and regular rhythm.     Heart sounds: No murmur heard. Pulmonary:     Effort: Pulmonary effort is normal.      Breath sounds: Normal breath sounds. No rales.  Abdominal:     General: Bowel sounds are normal.     Palpations: Abdomen is soft.     Tenderness: There is no abdominal tenderness.  Musculoskeletal:        General: Normal range of motion.     Cervical back: Neck supple.  Lymphadenopathy:     Cervical: No cervical adenopathy.  Skin:    General: Skin is warm and dry.     Findings: No rash.  Neurological:     Mental Status: She is alert and oriented to person, place, and time.     Cranial Nerves: No cranial nerve deficit.     Deep Tendon Reflexes: Reflexes are normal and symmetric.  Psychiatric:        Mood and Affect: Mood normal.           Assessment & Plan:   Problem List Items Addressed This Visit       Digestive   GERD (gastroesophageal reflux disease)    Controlled.  Continue omeprazole 20 mg daily.        Other   Insomnia    Overall controlled.  Continue fluoxetine 20 mg daily. Continue melatonin 10 mg HS. Continue Trazodone 50 mg HS PRN. Continue to monitor.       Hyperlipidemia    Reviewed lipid panel from her employer from August 2023. Continue rosuvastatin 5 mg daily.   Discussed the importance of a healthy diet and regular exercise in order for weight loss, and to reduce the risk of further co-morbidity.       Prediabetes    Reviewed A1C from labs from employer in August 2023.  Discussed the importance of a healthy diet and regular exercise in order for weight loss, and to reduce the risk of further co-morbidity.  Continue to monitor.       Anxiety and depression    Controlled.  Continue fluoxetine 20 mg daily. Refills provided.       Relevant Medications   traZODone (DESYREL) 50 MG tablet   Preventative health care - Primary    Shingrix and Pneumonia vaccines due, she declines today. Colon cancer screening due, she declines colonoscopy but opts for Cologuard. Will send kit to patient's home. Mammogram UTD. Bone density scan is  due, she will check in to having this done at her GYN's office.  Discussed the importance of a healthy diet and regular exercise in order for weight loss, and to reduce the risk of further co-morbidity.  Exam stable. Labs reviewed.  Follow up in 1 year for repeat physical.       Other Visit Diagnoses     Screening for  colon cancer       Relevant Orders   Cologuard          Pleas Koch, NP

## 2022-06-23 NOTE — Assessment & Plan Note (Signed)
Shingrix and Pneumonia vaccines due, she declines today. Colon cancer screening due, she declines colonoscopy but opts for Cologuard. Will send kit to patient's home. Mammogram UTD. Bone density scan is due, she will check in to having this done at her GYN's office.  Discussed the importance of a healthy diet and regular exercise in order for weight loss, and to reduce the risk of further co-morbidity.  Exam stable. Labs reviewed.  Follow up in 1 year for repeat physical.

## 2022-06-23 NOTE — Patient Instructions (Addendum)
Complete the Cologuard Kit once received.  Please schedule a physical to meet with me in 12 months.   Preventive Care 59 Years and Older, Female Preventive care refers to lifestyle choices and visits with your health care provider that can promote health and wellness. Preventive care visits are also called wellness exams. What can I expect for my preventive care visit? Counseling Your health care provider may ask you questions about your: Medical history, including: Past medical problems. Family medical history. Pregnancy and menstrual history. History of falls. Current health, including: Memory and ability to understand (cognition). Emotional well-being. Home life and relationship well-being. Sexual activity and sexual health. Lifestyle, including: Alcohol, nicotine or tobacco, and drug use. Access to firearms. Diet, exercise, and sleep habits. Work and work Statistician. Sunscreen use. Safety issues such as seatbelt and bike helmet use. Physical exam Your health care provider will check your: Height and weight. These may be used to calculate your BMI (body mass index). BMI is a measurement that tells if you are at a healthy weight. Waist circumference. This measures the distance around your waistline. This measurement also tells if you are at a healthy weight and may help predict your risk of certain diseases, such as type 2 diabetes and high blood pressure. Heart rate and blood pressure. Body temperature. Skin for abnormal spots. What immunizations do I need?  Vaccines are usually given at various ages, according to a schedule. Your health care provider will recommend vaccines for you based on your age, medical history, and lifestyle or other factors, such as travel or where you work. What tests do I need? Screening Your health care provider may recommend screening tests for certain conditions. This may include: Lipid and cholesterol levels. Hepatitis C test. Hepatitis B  test. HIV (human immunodeficiency virus) test. STI (sexually transmitted infection) testing, if you are at risk. Lung cancer screening. Colorectal cancer screening. Diabetes screening. This is done by checking your blood sugar (glucose) after you have not eaten for a while (fasting). Mammogram. Talk with your health care provider about how often you should have regular mammograms. BRCA-related cancer screening. This may be done if you have a family history of breast, ovarian, tubal, or peritoneal cancers. Bone density scan. This is done to screen for osteoporosis. Talk with your health care provider about your test results, treatment options, and if necessary, the need for more tests. Follow these instructions at home: Eating and drinking  Eat a diet that includes fresh fruits and vegetables, whole grains, lean protein, and low-fat dairy products. Limit your intake of foods with high amounts of sugar, saturated fats, and salt. Take vitamin and mineral supplements as recommended by your health care provider. Do not drink alcohol if your health care provider tells you not to drink. If you drink alcohol: Limit how much you have to 0-1 drink a day. Know how much alcohol is in your drink. In the [place] one drink equals one 12 oz bottle of beer (355 mL), one 5 oz glass of wine (148 mL), or one 1 oz glass of hard liquor (44 mL). Lifestyle Brush your teeth every morning and night with fluoride toothpaste. Floss one time each day. Exercise for at least 30 minutes 5 or more days each week. Do not use any products that contain nicotine or tobacco. These products include cigarettes, chewing tobacco, and vaping devices, such as e-cigarettes. If you need help quitting, ask your health care provider. Do not use drugs. If you are sexually active, practice safe  sex. Use a condom or other form of protection in order to prevent STIs. Take aspirin only as told by your health care provider. Make sure that you  understand how much to take and what form to take. Work with your health care provider to find out whether it is safe and beneficial for you to take aspirin daily. Ask your health care provider if you need to take a cholesterol-lowering medicine (statin). Find healthy ways to manage stress, such as: Meditation, yoga, or listening to music. Journaling. Talking to a trusted person. Spending time with friends and family. Minimize exposure to UV radiation to reduce your risk of skin cancer. Safety Always wear your seat belt while driving or riding in a vehicle. Do not drive: If you have been drinking alcohol. Do not ride with someone who has been drinking. When you are tired or distracted. While texting. If you have been using any mind-altering substances or drugs. Wear a helmet and other protective equipment during sports activities. If you have firearms in your house, make sure you follow all gun safety procedures. What's next? Visit your health care provider once a year for an annual wellness visit. Ask your health care provider how often you should have your eyes and teeth checked. Stay up to date on all vaccines. This information is not intended to replace advice given to you by your health care provider. Make sure you discuss any questions you have with your health care provider. Document Revised: 04/22/2021 Document Reviewed: 04/22/2021 Elsevier Patient Education  [place].

## 2022-06-25 ENCOUNTER — Encounter: Payer: Self-pay | Admitting: Primary Care

## 2022-07-04 ENCOUNTER — Other Ambulatory Visit: Payer: Self-pay | Admitting: Primary Care

## 2022-07-04 DIAGNOSIS — F419 Anxiety disorder, unspecified: Secondary | ICD-10-CM

## 2022-08-17 LAB — COLOGUARD: COLOGUARD: NEGATIVE

## 2022-09-04 ENCOUNTER — Other Ambulatory Visit: Payer: Self-pay | Admitting: Primary Care

## 2022-09-04 DIAGNOSIS — E785 Hyperlipidemia, unspecified: Secondary | ICD-10-CM

## 2022-10-28 ENCOUNTER — Other Ambulatory Visit: Payer: Self-pay | Admitting: Obstetrics

## 2022-10-28 DIAGNOSIS — R928 Other abnormal and inconclusive findings on diagnostic imaging of breast: Secondary | ICD-10-CM

## 2022-11-11 ENCOUNTER — Ambulatory Visit: Payer: Managed Care, Other (non HMO)

## 2022-11-11 ENCOUNTER — Ambulatory Visit
Admission: RE | Admit: 2022-11-11 | Discharge: 2022-11-11 | Disposition: A | Discharge status: Home / Self Care | Payer: Managed Care, Other (non HMO) | Source: Ambulatory Visit | Attending: Obstetrics | Admitting: Obstetrics

## 2022-11-11 DIAGNOSIS — R928 Other abnormal and inconclusive findings on diagnostic imaging of breast: Secondary | ICD-10-CM

## 2023-05-27 ENCOUNTER — Other Ambulatory Visit: Payer: Self-pay | Admitting: Primary Care

## 2023-05-27 DIAGNOSIS — E785 Hyperlipidemia, unspecified: Secondary | ICD-10-CM

## 2023-06-08 ENCOUNTER — Encounter (INDEPENDENT_AMBULATORY_CARE_PROVIDER_SITE_OTHER): Payer: Self-pay

## 2023-06-27 ENCOUNTER — Other Ambulatory Visit: Payer: Self-pay | Admitting: Primary Care

## 2023-06-27 DIAGNOSIS — F32A Depression, unspecified: Secondary | ICD-10-CM

## 2023-06-28 ENCOUNTER — Ambulatory Visit (INDEPENDENT_AMBULATORY_CARE_PROVIDER_SITE_OTHER): Payer: Managed Care, Other (non HMO) | Admitting: Primary Care

## 2023-06-28 ENCOUNTER — Encounter: Payer: Self-pay | Admitting: Primary Care

## 2023-06-28 VITALS — BP 126/82 | HR 80 | Temp 97.6°F | Ht 64.25 in | Wt 153.0 lb

## 2023-06-28 DIAGNOSIS — G4700 Insomnia, unspecified: Secondary | ICD-10-CM

## 2023-06-28 DIAGNOSIS — E2839 Other primary ovarian failure: Secondary | ICD-10-CM | POA: Diagnosis not present

## 2023-06-28 DIAGNOSIS — E785 Hyperlipidemia, unspecified: Secondary | ICD-10-CM

## 2023-06-28 DIAGNOSIS — F32A Depression, unspecified: Secondary | ICD-10-CM

## 2023-06-28 DIAGNOSIS — Z0000 Encounter for general adult medical examination without abnormal findings: Secondary | ICD-10-CM | POA: Diagnosis not present

## 2023-06-28 DIAGNOSIS — F419 Anxiety disorder, unspecified: Secondary | ICD-10-CM | POA: Diagnosis not present

## 2023-06-28 DIAGNOSIS — R7303 Prediabetes: Secondary | ICD-10-CM

## 2023-06-28 DIAGNOSIS — K219 Gastro-esophageal reflux disease without esophagitis: Secondary | ICD-10-CM

## 2023-06-28 NOTE — Progress Notes (Signed)
Subjective:    Patient ID: Carolyn Cooke, female    DOB: [birth year], 67 y.o.   MRN: [id number]  HPI  Carolyn Cooke is a very pleasant 67 y.o. female who presents today for complete physical and follow up of chronic conditions.  Immunizations: -Shingles: Completed 1 dose. -Pneumonia: Never completed, declines today  Diet: Fair diet.  Exercise: No regular exercise.  Eye exam: Completes annually  Dental exam: Completes semi-annually    Mammogram: Completed in January 2024 Bone Density Scan: Completed in 2021  Colonoscopy: Completed Cologuard in 2023, negative  BP Readings from Last 3 Encounters:  06/28/23 126/82  06/23/22 120/82  06/02/21 118/72       Review of Systems  Constitutional:  Negative for unexpected weight change.  HENT:  Negative for rhinorrhea.   Respiratory:  Negative for cough and shortness of breath.   Cardiovascular:  Negative for chest pain.  Gastrointestinal:  Negative for constipation and diarrhea.  Genitourinary:  Negative for difficulty urinating.  Musculoskeletal:  Negative for arthralgias and myalgias.  Skin:  Negative for rash.  Allergic/Immunologic: Negative for environmental allergies.  Neurological:  Negative for dizziness, numbness and headaches.  Psychiatric/Behavioral:  The patient is not nervous/anxious.          Past Medical History:  Diagnosis Date   COVID-19 virus infection 10/22/2020   GERD (gastroesophageal reflux disease)    Hyperlipidemia    Insomnia    Prediabetes     Social History   Socioeconomic History   Marital status: Married    Spouse name: Not on file   Number of children: Not on file   Years of education: Not on file   Highest education level: Not on file  Occupational History   Not on file  Tobacco Use   Smoking status: Never   Smokeless tobacco: Never  Substance and Sexual Activity   Alcohol use: Not on file   Drug use: Not on file   Sexual activity: Not on file  Other Topics Concern    Not on file  Social History Narrative   Not on file   Social Determinants of Health   Financial Resource Strain: Not on file  Food Insecurity: Not on file  Transportation Needs: Not on file  Physical Activity: Not on file  Stress: Not on file  Social Connections: Not on file  Intimate Partner Violence: Not on file    History reviewed. No pertinent surgical history.  Family History  Problem Relation Age of Onset   Arthritis Mother    Hypercholesterolemia Mother    Hypertension Mother    Hypertension Father     Allergies  Allergen Reactions   Cephalexin    Morphine    Prochlorperazine Other (See Comments)   Prochlorperazine Edisylate     Current Outpatient Medications on File Prior to Visit  Medication Sig Dispense Refill   FLUoxetine (PROZAC) 20 MG capsule TAKE 1 CAPSULE BY MOUTH DAILY FOR ANXIETY AND DEPRESSION 90 capsule 0   omeprazole (PRILOSEC) 20 MG capsule Take 20 mg by mouth daily.     rosuvastatin (CRESTOR) 5 MG tablet TAKE 1 TABLET BY MOUTH EVERY DAY FOR CHOLESTEROL 90 tablet 0   traZODone (DESYREL) 50 MG tablet Take 50 mg by mouth at bedtime as needed.     No current facility-administered medications on file prior to visit.    BP 126/82   Pulse 80   Temp 97.6 F (36.4 C) (Temporal)   Ht 5' 4.25" (1.632 m)   Wt  153 lb (69.4 kg)   SpO2 97%   BMI 26.06 kg/m  Objective:   Physical Exam HENT:     Right Ear: Tympanic membrane and ear canal normal.     Left Ear: Tympanic membrane and ear canal normal.     Nose: Nose normal.  Eyes:     Conjunctiva/sclera: Conjunctivae normal.     Pupils: Pupils are equal, round, and reactive to light.  Neck:     Thyroid: No thyromegaly.  Cardiovascular:     Rate and Rhythm: Normal rate and regular rhythm.     Heart sounds: No murmur heard. Pulmonary:     Effort: Pulmonary effort is normal.     Breath sounds: Normal breath sounds. No rales.  Abdominal:     General: Bowel sounds are normal.     Palpations:  Abdomen is soft.     Tenderness: There is no abdominal tenderness.  Musculoskeletal:        General: Normal range of motion.     Cervical back: Neck supple.  Lymphadenopathy:     Cervical: No cervical adenopathy.  Skin:    General: Skin is warm and dry.     Findings: No rash.  Neurological:     Mental Status: She is alert and oriented to person, place, and time.     Cranial Nerves: No cranial nerve deficit.     Deep Tendon Reflexes: Reflexes are normal and symmetric.           Assessment & Plan:  Preventative health care Assessment & Plan: Declines Prevnar 20 and second Shingrix vaccine. Mammogram UTD Bone density scan due, orders placed. Colon cancer screening UTD, due 2026  Discussed the importance of a healthy diet and regular exercise in order for weight loss, and to reduce the risk of further co-morbidity.  Exam stable. Labs pending.  Follow up in 1 year for repeat physical.    Estrogen deficiency -     DG Bone Density; Future  Anxiety and depression Assessment & Plan: Controlled.  Continue fluoxetine 20 mg daily.  Continue Trazodone 50 mg for which she uses sparingly.    Hyperlipidemia, unspecified hyperlipidemia type Assessment & Plan: Repeat lipid panel pending.  Discussed the importance of a healthy diet and regular exercise in order for weight loss, and to reduce the risk of further co-morbidity. Continue rosuvastatin 5 mg daily.  Orders: -     Lipid panel -     Comprehensive metabolic panel -     CBC  Insomnia, unspecified type Assessment & Plan: Stable.  Continue Trazodone 50 mg PRN for which she uses sparingly.   Prediabetes Assessment & Plan: Repeat A1C pending.  Discussed the importance of a healthy diet and regular exercise in order for weight loss, and to reduce the risk of further co-morbidity.   Orders: -     Hemoglobin A1c -     Comprehensive metabolic panel -     CBC  Gastroesophageal reflux disease, unspecified  whether esophagitis present Assessment & Plan: Controlled.  Continue omeprazole 20 mg daily.         Doreene Nest, NP

## 2023-06-28 NOTE — Assessment & Plan Note (Signed)
Controlled.   Continue omeprazole 20 mg daily. 

## 2023-06-28 NOTE — Assessment & Plan Note (Signed)
Repeat lipid panel pending.  Discussed the importance of a healthy diet and regular exercise in order for weight loss, and to reduce the risk of further co-morbidity. Continue rosuvastatin 5 mg daily.

## 2023-06-28 NOTE — Assessment & Plan Note (Signed)
Repeat A1C pending.  Discussed the importance of a healthy diet and regular exercise in order for weight loss, and to reduce the risk of further co-morbidity.  

## 2023-06-28 NOTE — Addendum Note (Signed)
Addended by: Vincenza Hews on: 06/28/2023 09:38 AM   Modules accepted: Orders

## 2023-06-28 NOTE — Assessment & Plan Note (Addendum)
Declines Prevnar 20 and second Shingrix vaccine. Mammogram UTD Bone density scan due, orders placed. Colon cancer screening UTD, due 2026  Discussed the importance of a healthy diet and regular exercise in order for weight loss, and to reduce the risk of further co-morbidity.  Exam stable. Labs pending.  Follow up in 1 year for repeat physical.

## 2023-06-28 NOTE — Assessment & Plan Note (Signed)
Controlled.  Continue fluoxetine 20 mg daily.  Continue Trazodone 50 mg for which she uses sparingly.

## 2023-06-28 NOTE — Assessment & Plan Note (Signed)
Stable.  Continue Trazodone 50 mg PRN for which she uses sparingly.

## 2023-06-28 NOTE — Patient Instructions (Addendum)
Stop by the lab prior to leaving today. I will notify you of your results once received.   It was a pleasure to see you today!        You have an order for:  []   2D Mammogram  []   3D Mammogram  [x]   Bone Density     Please call for appointment:   []   [facility]  [address] [place] [address]  [phone]  []   [facility]   [address]  [place], [place] [address]  [phone]  [x]   [facility]      [address] [place], [place]        [phone]         []   [facility]  [address] [place], [place]  [phone]  []  [facility]   [address]   [place], [place] [address]  [phone]  []  [facility]  [address] [place], [place] [address] [phone]    Make sure to wear two piece clothing  No lotions powders or deodorants the day of the appointment Make sure to bring picture ID and insurance card.  Bring list of medications you are currently taking including any supplements.   Schedule your screening mammogram through MyChart!   Select [place] imaging sites can now be scheduled through MyChart.  Log into your MyChart account.  Go to 'Visit' (or 'Appointments' if  on mobile App) --> Schedule an  Appointment  Under 'Select a Reason for Visit' choose the Mammogram  Screening option.  Complete the pre-visit questions  and select the time and place that  best fits your schedule

## 2023-06-29 ENCOUNTER — Other Ambulatory Visit: Payer: Self-pay | Admitting: Primary Care

## 2023-06-29 DIAGNOSIS — R7303 Prediabetes: Secondary | ICD-10-CM

## 2023-06-29 LAB — COMPREHENSIVE METABOLIC PANEL
ALT: 9 IU/L (ref 0–32)
AST: 14 IU/L (ref 0–40)
Albumin: 4.1 g/dL (ref 3.9–4.9)
Alkaline Phosphatase: 107 IU/L (ref 44–121)
BUN/Creatinine Ratio: 21 (ref 12–28)
BUN: 17 mg/dL (ref 8–27)
Bilirubin Total: 0.3 mg/dL (ref 0.0–1.2)
CO2: 25 mmol/L (ref 20–29)
Calcium: 9.4 mg/dL (ref 8.7–10.3)
Chloride: 103 mmol/L (ref 96–106)
Creatinine, Ser: 0.8 mg/dL (ref 0.57–1.00)
Globulin, Total: 2.4 g/dL (ref 1.5–4.5)
Glucose: 95 mg/dL (ref 70–99)
Potassium: 4.7 mmol/L (ref 3.5–5.2)
Sodium: 141 mmol/L (ref 134–144)
Total Protein: 6.5 g/dL (ref 6.0–8.5)
eGFR: 80 mL/min/{1.73_m2} (ref >59)

## 2023-06-29 LAB — LIPID PANEL
Chol/HDL Ratio: 4.2 ratio (ref 0.0–4.4)
Cholesterol, Total: 154 mg/dL (ref 100–199)
HDL: 37 mg/dL — ABNORMAL LOW (ref >39)
LDL Chol Calc (NIH): 92 mg/dL (ref 0–99)
Triglycerides: 141 mg/dL (ref 0–149)
VLDL Cholesterol Cal: 25 mg/dL (ref 5–40)

## 2023-06-29 LAB — HEMOGLOBIN A1C
Est. average glucose Bld gHb Est-mCnc: 137 mg/dL
Hgb A1c MFr Bld: 6.4 % — ABNORMAL HIGH (ref 4.8–5.6)

## 2023-06-29 LAB — CBC
Hematocrit: 39.4 % (ref 34.0–46.6)
Hemoglobin: 12.7 g/dL (ref 11.1–15.9)
MCH: 27.1 pg (ref 26.6–33.0)
MCHC: 32.2 g/dL (ref 31.5–35.7)
MCV: 84 fL (ref 79–97)
Platelets: 367 10*3/uL (ref 150–450)
RBC: 4.68 x10E6/uL (ref 3.77–5.28)
RDW: 13.3 % (ref 11.7–15.4)
WBC: 7.3 10*3/uL (ref 3.4–10.8)

## 2023-08-08 IMAGING — MR MR BREAST BILAT WO/W CM
8 of 12 series · 33 of 48 positions shown · IV contrast (7 ML GADAVIST)
Comparison: Previous exam(s).

CLINICAL DATA: [DATE] month history clear left nipple discharge.
Recent negative mammographic/sonographic workup.

EXAM:
BILATERAL BREAST MRI WITH AND WITHOUT CONTRAST
TECHNIQUE: Multiplanar, multisequence MR images of both breasts were obtained
prior to and following the intravenous administration of 7 ml of
Gadavist.

[Series 2: t2_tirm_tra ipat (a-p) · axial · 3.0mm · 0.66mm/px · 1 of 60 slices shown]
[im 1/60]
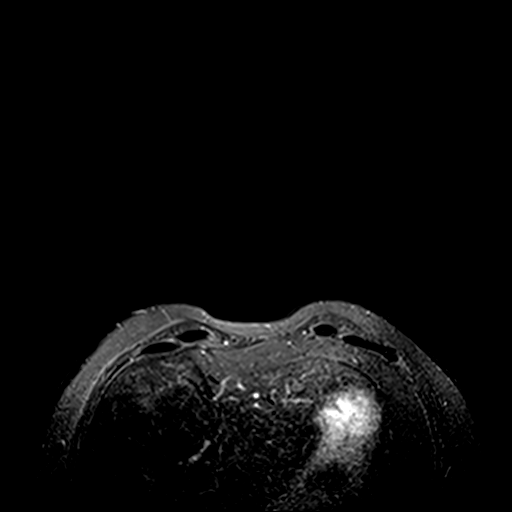

[Series 3: fl3d pre-cm non · axial · non-contrast · 1.2mm · 0.86mm/px · z∈[-49,+122]mm · 5 of 144 slices shown]
[im 1/144]
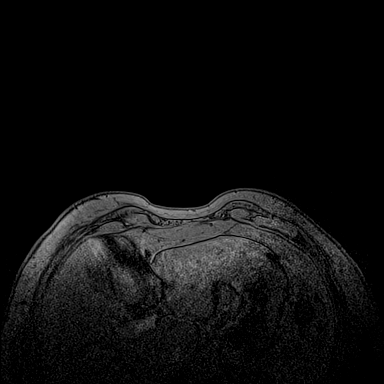
[im 36/144]
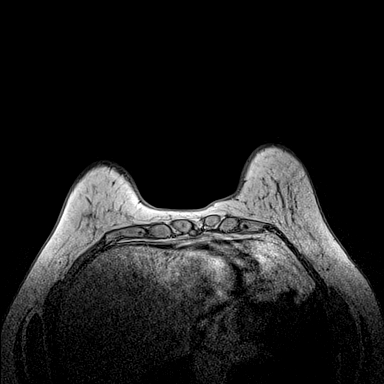
[im 72/144]
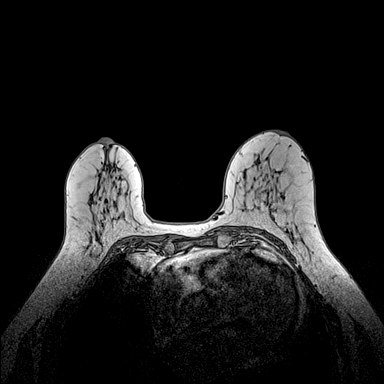
[im 108/144]
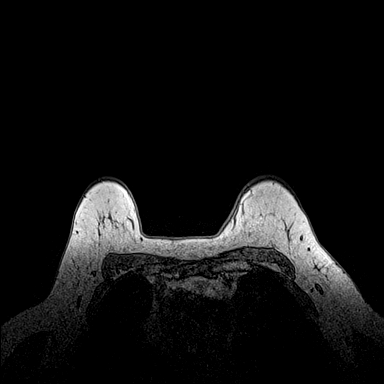
[im 144/144]
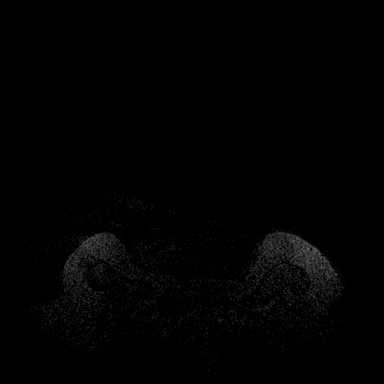

[Series 4: fl3d pre-cm · axial · non-contrast · 1.2mm · 0.86mm/px · z∈[-49,+122]mm · 5 of 144 slices shown]
[im 1/144]
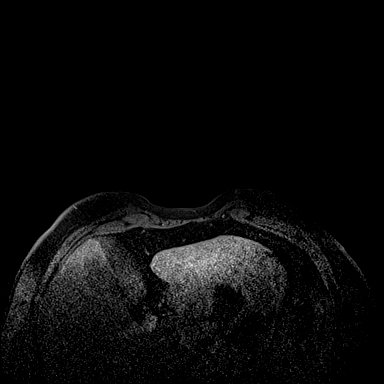
[im 36/144]
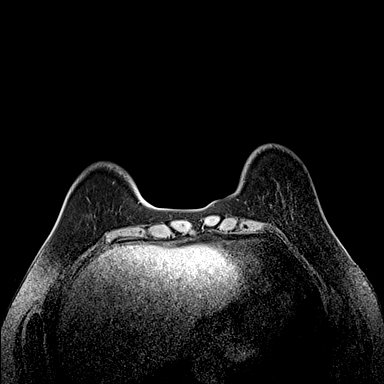
[im 72/144]
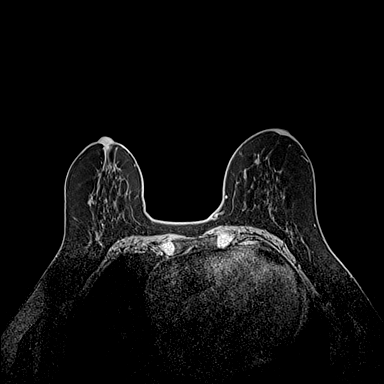
[im 108/144]
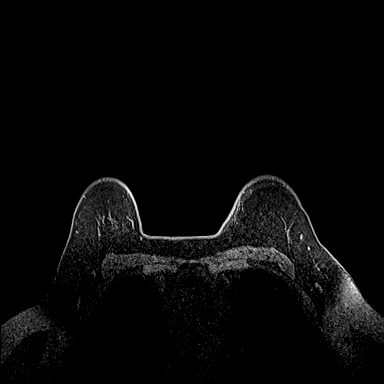
[im 144/144]
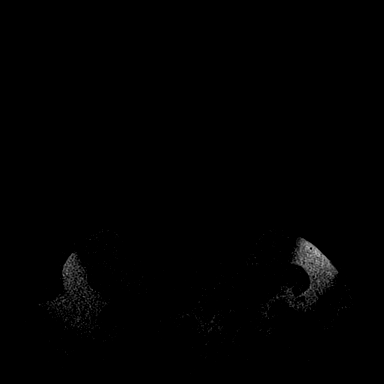

[Series 5: fl3d pre-cm 20 · axial · non-contrast · 1.2mm · 0.86mm/px · z∈[-49,+122]mm · 5 of 144 slices shown (1 of 3)]
[im 1/144]
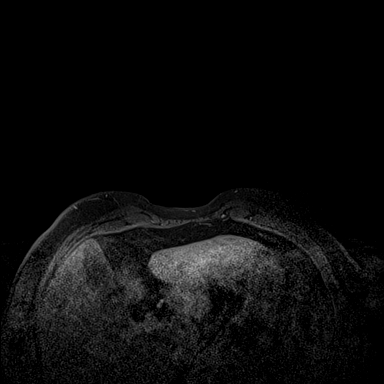
[im 36/144]
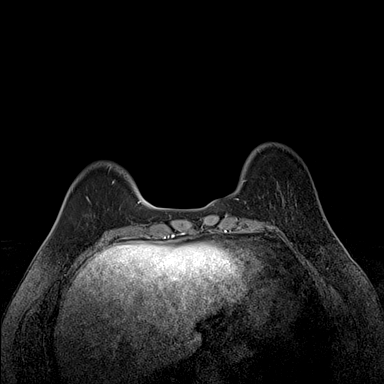
[im 72/144]
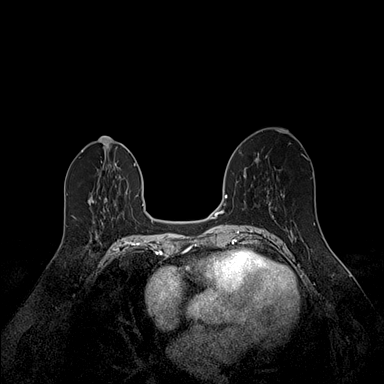
[im 108/144]
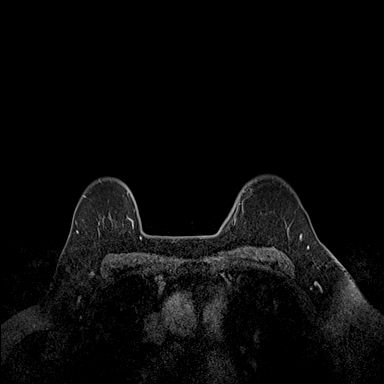
[im 144/144]
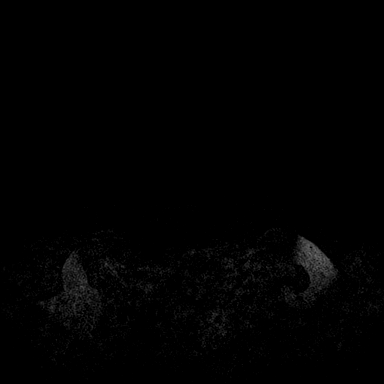

[Series 6: fl3d pre-cm 20 · axial · non-contrast · 1.2mm · 0.86mm/px · z∈[-49,+122]mm · 5 of 144 slices shown (2 of 3)]
[im 1/144]
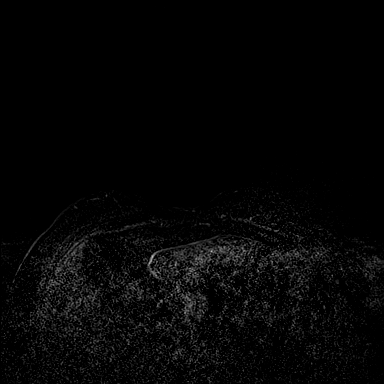
[im 36/144]
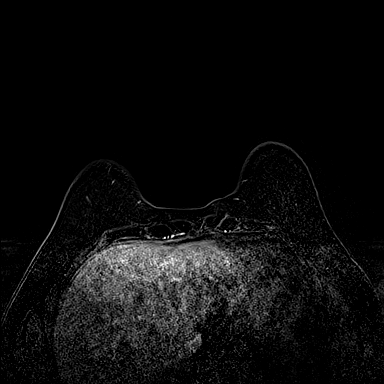
[im 72/144]
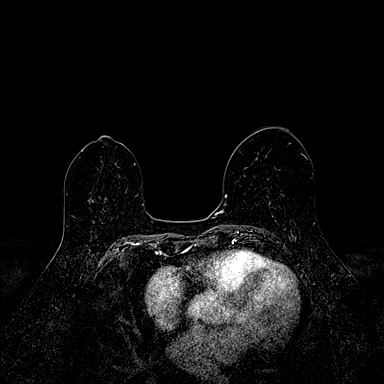
[im 108/144]
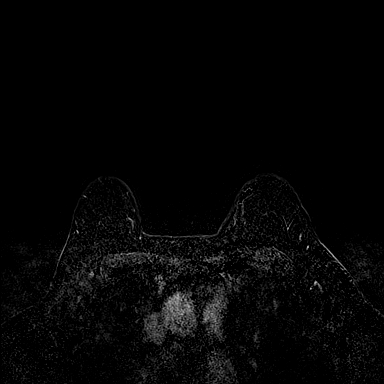
[im 144/144]
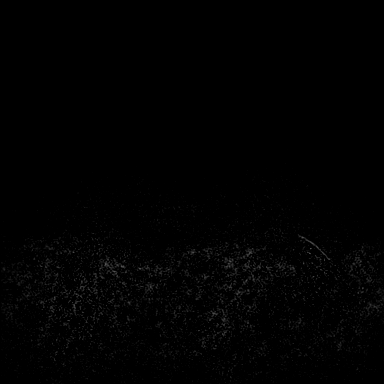

[Series 7: fl3d pre-cm 20 · axial · non-contrast · 172.8mm · 0.86mm/px · 1 of 1 slices shown (3 of 3)]
[im 1/1]
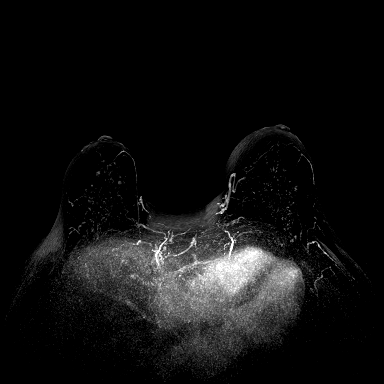

[Series 8: fl3d pre-cm 3min · axial · non-contrast · 1.2mm · 0.86mm/px · z∈[-49,+122]mm · 6 of 144 slices shown]
[im 1/144]
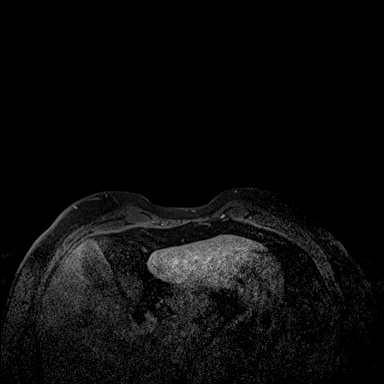
[im 29/144]
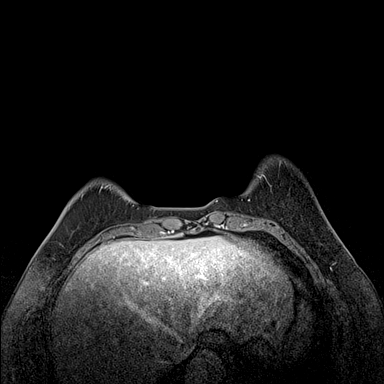
[im 58/144]
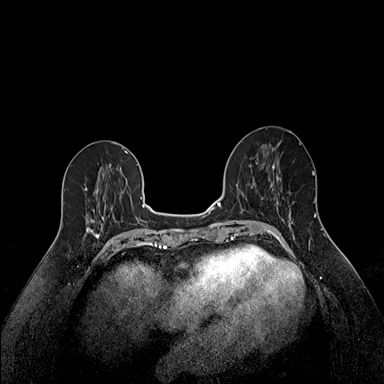
[im 86/144]
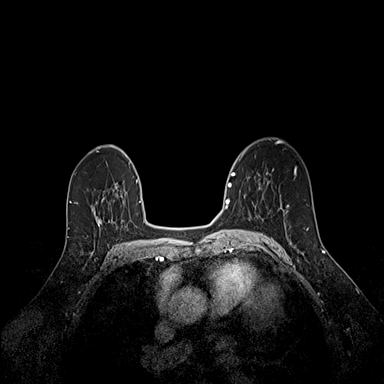
[im 115/144]
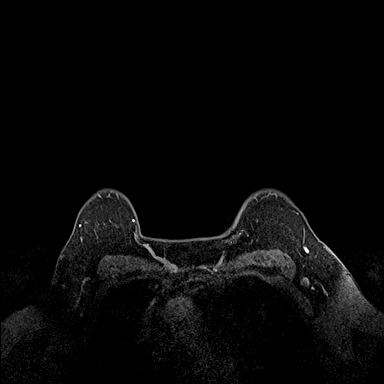
[im 144/144]
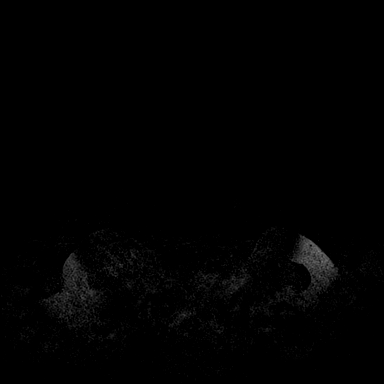

[Series 9: fl3d pre-cm 3min_sub · axial · non-contrast · 1.2mm · 0.86mm/px · z∈[-49,+88]mm · 5 of 144 slices shown]
[im 1/144]
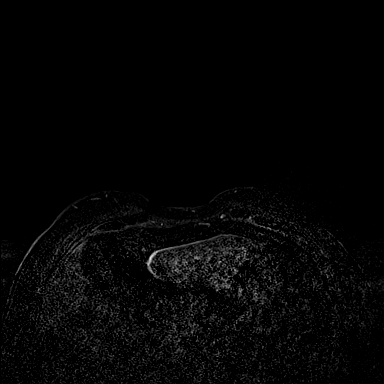
[im 29/144]
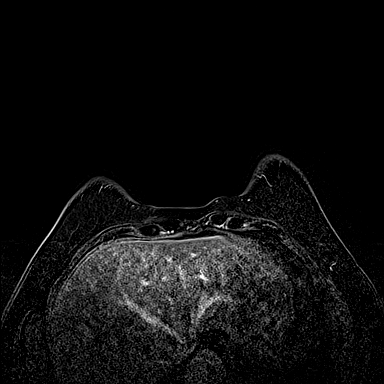
[im 58/144]
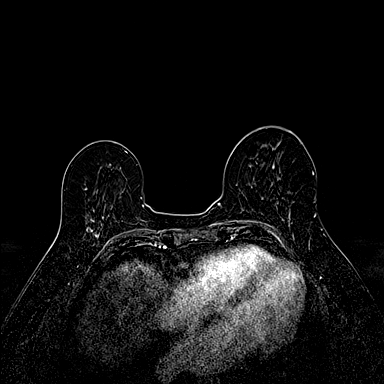
[im 86/144]
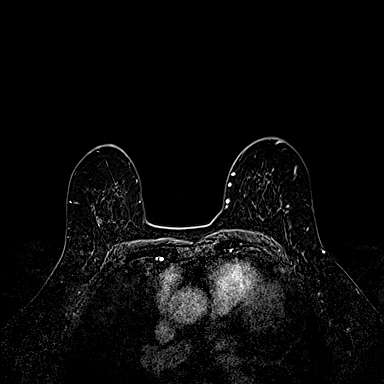
[im 115/144]
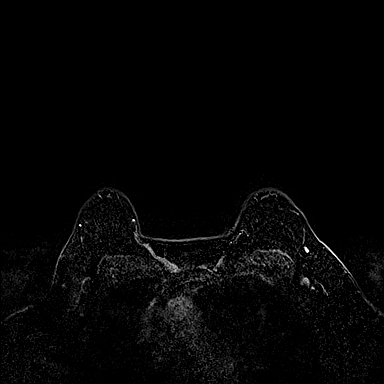

[33 of 48 positions shown; findings below may reference images not displayed]

Three-dimensional MR images were rendered by post-processing of the
original MR data on an independent workstation. The
three-dimensional MR images were interpreted, and findings are
reported in the following complete MRI report for this study. Three
dimensional images were evaluated at the independent interpreting
workstation using the DynaCAD thin client.
FINDINGS: Breast composition: c. Heterogeneous fibroglandular tissue.

Background parenchymal enhancement: Moderate

Right breast: No suspicious mass or abnormal enhancement.

Left breast: No suspicious mass or abnormal enhancement. No focal
abnormality over the retroareolar region. Subcentimeter oval mass
versus 2 immediately adjacent masses over the posterior upper outer
breast which appear to have intrinsic fat on T1 weighted images and
stable mammographically likely representing intramammary lymph node.

Lymph nodes: No abnormal appearing lymph nodes.

Ancillary findings:  None.
IMPRESSION: No suspicious masses or enhancement within either breast. No focal
abnormality to account for patient's left nipple discharge.

RECOMMENDATION:
Recommend continued management of patient's left nipple discharge on
a clinical basis with surgical consultation.

BI-RADS CATEGORY  1: Negative.

## 2023-08-25 ENCOUNTER — Other Ambulatory Visit: Payer: Self-pay | Admitting: Primary Care

## 2023-08-25 DIAGNOSIS — E785 Hyperlipidemia, unspecified: Secondary | ICD-10-CM

## 2023-09-23 ENCOUNTER — Other Ambulatory Visit: Payer: Self-pay

## 2023-09-23 DIAGNOSIS — F32A Depression, unspecified: Secondary | ICD-10-CM

## 2023-09-23 MED ORDER — FLUOXETINE HCL 20 MG PO CAPS
20.0000 mg | ORAL_CAPSULE | Freq: Every day | ORAL | 2 refills | Status: DC
Start: 2023-09-23 — End: 2024-07-18

## 2023-12-06 DIAGNOSIS — Z1231 Encounter for screening mammogram for malignant neoplasm of breast: Secondary | ICD-10-CM | POA: Diagnosis not present

## 2023-12-06 DIAGNOSIS — Z01419 Encounter for gynecological examination (general) (routine) without abnormal findings: Secondary | ICD-10-CM | POA: Diagnosis not present

## 2023-12-06 LAB — HM MAMMOGRAPHY

## 2024-02-14 ENCOUNTER — Ambulatory Visit
Admission: RE | Admit: 2024-02-14 | Discharge: 2024-02-14 | Disposition: A | Discharge status: Home / Self Care | Payer: Managed Care, Other (non HMO) | Source: Ambulatory Visit | Attending: Primary Care | Admitting: Primary Care

## 2024-02-14 DIAGNOSIS — E2839 Other primary ovarian failure: Secondary | ICD-10-CM

## 2024-02-14 DIAGNOSIS — N958 Other specified menopausal and perimenopausal disorders: Secondary | ICD-10-CM | POA: Diagnosis not present

## 2024-03-29 DIAGNOSIS — K080 Exfoliation of teeth due to systemic causes: Secondary | ICD-10-CM | POA: Diagnosis not present

## 2024-05-28 DIAGNOSIS — E785 Hyperlipidemia, unspecified: Secondary | ICD-10-CM

## 2024-05-28 MED ORDER — ROSUVASTATIN CALCIUM 5 MG PO TABS
ORAL_TABLET | ORAL | 0 refills | Status: DC
Start: 2024-05-28 — End: 2024-07-20

## 2024-06-28 ENCOUNTER — Encounter: Payer: Managed Care, Other (non HMO) | Admitting: Primary Care

## 2024-07-17 ENCOUNTER — Encounter: Payer: Self-pay | Admitting: Primary Care

## 2024-07-17 ENCOUNTER — Ambulatory Visit: Payer: Self-pay | Admitting: Primary Care

## 2024-07-17 ENCOUNTER — Ambulatory Visit (INDEPENDENT_AMBULATORY_CARE_PROVIDER_SITE_OTHER): Admitting: Primary Care

## 2024-07-17 ENCOUNTER — Encounter: Payer: Self-pay | Admitting: Obstetrics

## 2024-07-17 VITALS — BP 122/74 | HR 65 | Temp 98.1°F | Ht 64.25 in | Wt 151.0 lb

## 2024-07-17 DIAGNOSIS — K219 Gastro-esophageal reflux disease without esophagitis: Secondary | ICD-10-CM

## 2024-07-17 DIAGNOSIS — E785 Hyperlipidemia, unspecified: Secondary | ICD-10-CM | POA: Diagnosis not present

## 2024-07-17 DIAGNOSIS — Z0000 Encounter for general adult medical examination without abnormal findings: Secondary | ICD-10-CM | POA: Diagnosis not present

## 2024-07-17 DIAGNOSIS — F419 Anxiety disorder, unspecified: Secondary | ICD-10-CM | POA: Diagnosis not present

## 2024-07-17 DIAGNOSIS — R7303 Prediabetes: Secondary | ICD-10-CM

## 2024-07-17 DIAGNOSIS — F32A Depression, unspecified: Secondary | ICD-10-CM

## 2024-07-17 DIAGNOSIS — G4700 Insomnia, unspecified: Secondary | ICD-10-CM

## 2024-07-17 LAB — LIPID PANEL
Cholesterol: 168 mg/dL (ref 0–200)
HDL: 38.7 mg/dL — ABNORMAL LOW (ref >39.00)
LDL Cholesterol: 108 mg/dL — ABNORMAL HIGH (ref 0–99)
NonHDL: 129.55
Total CHOL/HDL Ratio: 4
Triglycerides: 110 mg/dL (ref 0.0–149.0)
VLDL: 22 mg/dL (ref 0.0–40.0)

## 2024-07-17 LAB — COMPREHENSIVE METABOLIC PANEL WITH GFR
ALT: 10 U/L (ref 0–35)
AST: 13 U/L (ref 0–37)
Albumin: 4 g/dL (ref 3.5–5.2)
Alkaline Phosphatase: 84 U/L (ref 39–117)
BUN: 12 mg/dL (ref 6–23)
CO2: 31 meq/L (ref 19–32)
Calcium: 9.4 mg/dL (ref 8.4–10.5)
Chloride: 104 meq/L (ref 96–112)
Creatinine, Ser: 0.74 mg/dL (ref 0.40–1.20)
GFR: 82.72 mL/min (ref >60.00)
Glucose, Bld: 95 mg/dL (ref 70–99)
Potassium: 4.2 meq/L (ref 3.5–5.1)
Sodium: 141 meq/L (ref 135–145)
Total Bilirubin: 0.4 mg/dL (ref 0.2–1.2)
Total Protein: 6.3 g/dL (ref 6.0–8.3)

## 2024-07-17 LAB — HEMOGLOBIN A1C: Hgb A1c MFr Bld: 6.9 % — ABNORMAL HIGH (ref 4.6–6.5)

## 2024-07-17 NOTE — Progress Notes (Signed)
 Subjective:    Patient ID: Carolyn Cooke, female    DOB: Oct 01, 1956, 68 y.o.   MRN: [id number]  Carolyn Cooke is a very pleasant 68 y.o. female who presents today for complete physical and follow up of chronic conditions.  Immunizations:  -Influenza: Declines influenza vaccine.  -Shingles: Completed 1 dose of Shingrix, declines second vaccine. -Pneumonia: Never completed, declined last year, declines today.  Diet: Fair diet.  Exercise: No regular exercise.  Eye exam: Completes annually  Dental exam: Completes semi-annually    Mammogram: Completed in 2024 per GYN. Bone Density Scan: Completed in April 2025  Colonoscopy: Completed Cologuard in 2023, due 2026  BP Readings from Last 3 Encounters:  07/17/24 122/74  06/28/23 126/82  06/23/22 120/82      Review of Systems  Constitutional:  Negative for unexpected weight change.  HENT:  Negative for rhinorrhea.   Respiratory:  Negative for cough and shortness of breath.   Cardiovascular:  Negative for chest pain.  Gastrointestinal:  Negative for constipation and diarrhea.  Genitourinary:  Negative for difficulty urinating.  Musculoskeletal:  Negative for arthralgias and myalgias.  Skin:  Negative for rash.  Allergic/Immunologic: Negative for environmental allergies.  Neurological:  Negative for dizziness and headaches.  Psychiatric/Behavioral:  The patient is not nervous/anxious.          Past Medical History:  Diagnosis Date   COVID-19 virus infection 10/22/2020   GERD (gastroesophageal reflux disease)    Hyperlipidemia    Insomnia    Prediabetes     Social History   Socioeconomic History   Marital status: Married    Spouse name: Not on file   Number of children: Not on file   Years of education: Not on file   Highest education level: Not on file  Occupational History   Not on file  Tobacco Use   Smoking status: Never   Smokeless tobacco: Never  Substance and Sexual Activity   Alcohol use:  Not on file   Drug use: Not on file   Sexual activity: Not on file  Other Topics Concern   Not on file  Social History Narrative   Not on file   Social Drivers of Health   Financial Resource Strain: Not on file  Food Insecurity: Not on file  Transportation Needs: Not on file  Physical Activity: Not on file  Stress: Not on file  Social Connections: Not on file  Intimate Partner Violence: Not on file    History reviewed. No pertinent surgical history.  Family History  Problem Relation Age of Onset   Arthritis Mother    Hypercholesterolemia Mother    Hypertension Mother    Hypertension Father     Allergies  Allergen Reactions   Cephalexin    Morphine    Prochlorperazine Other (See Comments)   Prochlorperazine Edisylate     Current Outpatient Medications on File Prior to Visit  Medication Sig Dispense Refill   FLUoxetine  (PROZAC ) 20 MG capsule Take 1 capsule (20 mg total) by mouth daily. for anxiety and depression. 90 capsule 2   omeprazole (PRILOSEC) 20 MG capsule Take 20 mg by mouth daily.     rosuvastatin  (CRESTOR ) 5 MG tablet TAKE 1 TABLET BY MOUTH EVERY DAY FOR CHOLESTEROL 90 tablet 0   traZODone  (DESYREL ) 50 MG tablet Take 50 mg by mouth at bedtime as needed. (Patient not taking: Reported on 07/17/2024)     No current facility-administered medications on file prior to visit.    BP 122/74  Pulse 65   Temp 98.1 F (36.7 C) (Temporal)   Ht 5' 4.25 (1.632 m)   Wt 151 lb (68.5 kg)   SpO2 97%   BMI 25.72 kg/m  Objective:   Physical Exam HENT:     Right Ear: Tympanic membrane and ear canal normal.     Left Ear: Tympanic membrane and ear canal normal.  Eyes:     Pupils: Pupils are equal, round, and reactive to light.  Cardiovascular:     Rate and Rhythm: Normal rate and regular rhythm.  Pulmonary:     Effort: Pulmonary effort is normal.     Breath sounds: Normal breath sounds.  Abdominal:     General: Bowel sounds are normal.     Palpations: Abdomen is  soft.     Tenderness: There is no abdominal tenderness.  Musculoskeletal:        General: Normal range of motion.     Cervical back: Neck supple.  Skin:    General: Skin is warm and dry.  Neurological:     Mental Status: She is alert and oriented to person, place, and time.     Cranial Nerves: No cranial nerve deficit.     Deep Tendon Reflexes:     Reflex Scores:      Patellar reflexes are 2+ on the right side and 2+ on the left side. Psychiatric:        Mood and Affect: Mood normal.     Physical Exam        Assessment & Plan:  Preventative health care Assessment & Plan: Declines pneumonia vaccine.  We discussed the second Shingrix vaccine.  Declines influenza vaccine. Mammogram UTD per patient, follows with GYN Colon cancer screening due in 2026  Discussed the importance of a healthy diet and regular exercise in order for weight loss, and to reduce the risk of further co-morbidity.  Exam stable. Labs pending.  Follow up in 1 year for repeat physical.    Anxiety and depression Assessment & Plan: Controlled.  Continue fluoxetine  20 mg daily.    Hyperlipidemia, unspecified hyperlipidemia type Assessment & Plan: Repeat lipid pending.  Continue rosuvastatin  5 mg daily   Orders: -     Comprehensive metabolic panel with GFR -     Lipid panel  Prediabetes Assessment & Plan: Repeat A1C pending.  Orders: -     Hemoglobin A1c  Gastroesophageal reflux disease, unspecified whether esophagitis present Assessment & Plan: Controlled.  Discussed switching to famotidine 20 mg daily. She will think about this.  Continue omeprazole 20 mg daily.   Insomnia, unspecified type Assessment & Plan: Controlled.  Continue trazodone  50 mg at bedtime as needed.     Assessment and Plan Assessment & Plan         Comer MARLA Gaskins, NP    History of Present Illness

## 2024-07-17 NOTE — Assessment & Plan Note (Signed)
 Controlled.  Discussed switching to famotidine 20 mg daily. She will think about this.  Continue omeprazole 20 mg daily.

## 2024-07-17 NOTE — Assessment & Plan Note (Signed)
 Controlled.  Continue fluoxetine 20 mg daily.

## 2024-07-17 NOTE — Assessment & Plan Note (Signed)
 Repeat A1C pending.

## 2024-07-17 NOTE — Patient Instructions (Signed)
 Stop by the lab prior to leaving today. I will notify you of your results once received.   It was a pleasure to see you today!

## 2024-07-17 NOTE — Assessment & Plan Note (Signed)
 Declines pneumonia vaccine.  We discussed the second Shingrix vaccine.  Declines influenza vaccine. Mammogram UTD per patient, follows with GYN Colon cancer screening due in 2026  Discussed the importance of a healthy diet and regular exercise in order for weight loss, and to reduce the risk of further co-morbidity.  Exam stable. Labs pending.  Follow up in 1 year for repeat physical.

## 2024-07-17 NOTE — Assessment & Plan Note (Signed)
 Controlled.  Continue trazodone 50 mg at bedtime as needed.

## 2024-07-17 NOTE — Assessment & Plan Note (Signed)
 Repeat lipid pending.  Continue rosuvastatin  5 mg daily

## 2024-07-18 ENCOUNTER — Other Ambulatory Visit: Payer: Self-pay

## 2024-07-18 ENCOUNTER — Encounter: Payer: Self-pay | Admitting: Obstetrics

## 2024-07-18 DIAGNOSIS — F32A Depression, unspecified: Secondary | ICD-10-CM

## 2024-07-18 MED ORDER — FLUOXETINE HCL 20 MG PO CAPS
20.0000 mg | ORAL_CAPSULE | Freq: Every day | ORAL | 3 refills | Status: AC
Start: 2024-07-18 — End: ?

## 2024-07-20 MED ORDER — ROSUVASTATIN CALCIUM 10 MG PO TABS: 10.0000 mg | ORAL_TABLET | Freq: Every day | ORAL | 3 refills | Status: AC

## 2024-07-23 DIAGNOSIS — H524 Presbyopia: Secondary | ICD-10-CM | POA: Diagnosis not present

## 2024-08-25 ENCOUNTER — Other Ambulatory Visit: Payer: Self-pay | Admitting: Primary Care

## 2024-08-25 DIAGNOSIS — E785 Hyperlipidemia, unspecified: Secondary | ICD-10-CM

## 2024-09-26 ENCOUNTER — Ambulatory Visit

## 2024-09-26 VITALS — Ht 64.0 in | Wt 151.0 lb

## 2024-09-26 DIAGNOSIS — Z0000 Encounter for general adult medical examination without abnormal findings: Secondary | ICD-10-CM

## 2024-09-26 NOTE — Progress Notes (Signed)
 Chief Complaint  Patient presents with   Medicare Wellness     Subjective:   Carolyn Cooke is a 68 y.o. female who presents for a Medicare Annual Wellness Visit.  Allergies (verified) Cephalexin, Morphine, Prochlorperazine, and Prochlorperazine edisylate   History: Past Medical History:  Diagnosis Date   COVID-19 virus infection 10/22/2020   GERD (gastroesophageal reflux disease)    Hyperlipidemia    Insomnia    Prediabetes    History reviewed. No pertinent surgical history. Family History  Problem Relation Age of Onset   Arthritis Mother    Hypercholesterolemia Mother    Hypertension Mother    Hypertension Father    Social History   Occupational History   Not on file  Tobacco Use   Smoking status: Never   Smokeless tobacco: Never  Substance and Sexual Activity   Alcohol use: Not on file   Drug use: Not on file   Sexual activity: Yes   Tobacco Counseling Counseling given: Not Answered  SDOH Screenings   Food Insecurity: No Food Insecurity (09/26/2024)  Housing: Unknown (09/26/2024)  Transportation Needs: No Transportation Needs (09/26/2024)  Utilities: Not At Risk (09/26/2024)  Depression (PHQ2-9): Low Risk  (09/26/2024)  Physical Activity: Insufficiently Active (09/26/2024)  Social Connections: Moderately Isolated (09/26/2024)  Stress: No Stress Concern Present (09/26/2024)  Tobacco Use: Low Risk  (09/26/2024)  Health Literacy: Adequate Health Literacy (09/26/2024)   See flowsheets for full screening details  Depression Screen PHQ 2 & 9 Depression Scale- Over the past 2 weeks, how often have you been bothered by any of the following problems? Little interest or pleasure in doing things: 0 Feeling down, depressed, or hopeless (PHQ Adolescent also includes...irritable): 0 PHQ-2 Total Score: 0 Trouble falling or staying asleep, or sleeping too much: 0 Feeling tired or having little energy: 0 Poor appetite or overeating (PHQ Adolescent also  includes...weight loss): 0 Feeling bad about yourself - or that you are a failure or have let yourself or your family down: 0 Trouble concentrating on things, such as reading the newspaper or watching television (PHQ Adolescent also includes...like school work): 0 Moving or speaking so slowly that other people could have noticed. Or the opposite - being so fidgety or restless that you have been moving around a lot more than usual: 0 Thoughts that you would be better off dead, or of hurting yourself in some way: 0 PHQ-9 Total Score: 0 If you checked off any problems, how difficult have these problems made it for you to do your work, take care of things at home, or get along with other people?: Not difficult at all  Depression Treatment Depression Interventions/Treatment : EYV7-0 Score <4 Follow-up Not Indicated     Goals Addressed               This Visit's Progress     Patient Stated (pt-stated)        Patient stated she plans to manage her diet and sugar intake.       Visit info / Clinical Intake: Medicare Wellness Visit Type:: Initial Annual Wellness Visit Persons participating in visit:: patient Medicare Wellness Visit Mode:: Telephone If telephone:: video declined Because this visit was a virtual/telehealth visit:: vitals recorded from last visit If Telephone or Video please confirm:: I connected with the patient using audio enabled telemedicine application and verified that I am speaking with the correct person using two identifiers; I discussed the limitations of evaluation and management by telemedicine; The patient expressed understanding and agreed to  proceed Patient Location:: Home Provider Location:: Office Information given by:: patient Interpreter Needed?: No Pre-visit prep was completed: yes AWV questionnaire completed by patient prior to visit?: no Living arrangements:: lives with spouse/significant other Patient's Overall Health Status Rating: good Typical  amount of pain: none Does pain affect daily life?: no Are you currently prescribed opioids?: no  Dietary Habits and Nutritional Risks How many meals a day?: 2 Eats fruit and vegetables daily?: yes Most meals are obtained by: preparing own meals; eating out In the last 2 weeks, have you had any of the following?: none Diabetic:: no  Functional Status Activities of Daily Living (to include ambulation/medication): Independent Ambulation: Independent with device- listed below Home Assistive Devices/Equipment: Eyeglasses Medication Administration: Independent Home Management: Independent Manage your own finances?: yes Primary transportation is: driving Concerns about vision?: no *vision screening is required for WTM* Concerns about hearing?: no  Fall Screening Falls in the past year?: 1 Number of falls in past year: 0 (1) Was there an injury with Fall?: 0 Fall Risk Category Calculator: 1 Patient Fall Risk Level: Low Fall Risk  Fall Risk Patient at Risk for Falls Due to: Other (Comment) (tripped and fell) Fall risk Follow up: Falls evaluation completed; Falls prevention discussed  Home and Transportation Safety: All rugs have non-skid backing?: yes All stairs or steps have railings?: yes Grab bars in the bathtub or shower?: yes Have non-skid surface in bathtub or shower?: yes Good home lighting?: yes Regular seat belt use?: yes Hospital stays in the last year:: no  Cognitive Assessment Difficulty concentrating, remembering, or making decisions? : no Will 6CIT or Mini Cog be Completed: yes What year is it?: 0 points What month is it?: 0 points Give patient an address phrase to remember (5 components): [address] [place], [place] About what time is it?: 0 points Count backwards from 20 to 1: 0 points Say the months of the year in reverse: 0 points Repeat the address phrase from earlier: 0 points 6 CIT Score: 0 points  Advance Directives (For Healthcare) Does Patient  Have a Medical Advance Directive?: Yes Does patient want to make changes to medical advance directive?: Yes (Inpatient - patient requests chaplain consult to change a medical advance directive) Type of Advance Directive: Healthcare Power of [place]; Living will Copy of Healthcare Power of Attorney in Chart?: No - copy requested Copy of Living Will in Chart?: No - copy requested  Reviewed/Updated  Reviewed/Updated: Reviewed All (Medical, Surgical, Family, Medications, Allergies, Care Teams, Patient Goals)        Objective:    Today's Vitals   09/26/24 1355  Weight: 151 lb (68.5 kg)  Height: 5' 4 (1.626 m)   Body mass index is 25.92 kg/m.  Current Medications (verified) Outpatient Encounter Medications as of 09/26/2024  Medication Sig   FLUoxetine  (PROZAC ) 20 MG capsule Take 1 capsule (20 mg total) by mouth daily. for anxiety and depression.   melatonin 1 MG TABS tablet Take 1 mg by mouth at bedtime.   omeprazole (PRILOSEC) 20 MG capsule Take 20 mg by mouth daily.   rosuvastatin  (CRESTOR ) 10 MG tablet Take 1 tablet (10 mg total) by mouth daily. for cholesterol.   traZODone  (DESYREL ) 50 MG tablet Take 50 mg by mouth at bedtime as needed.   No facility-administered encounter medications on file as of 09/26/2024.   Hearing/Vision screen Hearing Screening - Comments:: Denies hearing difficulties   Vision Screening - Comments:: Wears rx glasses - up to date with routine eye exams with  Dr [facility], [place]) Immunizations and Health Maintenance Health Maintenance  Topic Date Due   Influenza Vaccine  02/05/2025 (Originally 06/08/2024)   DTaP/Tdap/Td (1 - Tdap) 07/17/2025 (Originally [birth year]/28/1975)   Fecal DNA (Cologuard)  08/11/2025   Medicare Annual Wellness (AWV)  09/26/2025   Mammogram  12/05/2025   Bone Density Scan  Completed   Hepatitis C Screening  Completed   Meningococcal B Vaccine  Aged Out   Pneumococcal Vaccine: 50+ Years  Discontinued   COVID-19 Vaccine   Discontinued   Zoster Vaccines- Shingrix  Discontinued        Assessment/Plan:  This is a routine wellness examination for [facility].  Patient Care Team: Gretta Comer POUR, NP as PCP - General (Internal Medicine) Gretta Gums, MD as Consulting Physician (Obstetrics) [facility])  I have personally reviewed and noted the following in the patient's chart:   Medical and social history Use of alcohol, tobacco or illicit drugs  Current medications and supplements including opioid prescriptions. Functional ability and status Nutritional status Physical activity Advanced directives List of other physicians Hospitalizations, surgeries, and ER visits in previous 12 months Vitals Screenings to include cognitive, depression, and falls Referrals and appointments  No orders of the defined types were placed in this encounter.  In addition, I have reviewed and discussed with patient certain preventive protocols, quality metrics, and best practice recommendations. A written personalized care plan for preventive services as well as general preventive health recommendations were provided to patient.   Verdie CHRISTELLA Saba, CMA   09/26/2024   Return in 1 year (on 09/26/2025).  After Visit Summary: (MyChart) Due to this being a telephonic visit, the after visit summary with patients personalized plan was offered to patient via MyChart   Nurse Notes: Scheduled AWV/CPE appts.

## 2024-09-26 NOTE — Patient Instructions (Addendum)
 Carolyn Cooke,  Thank you for taking the time for your Medicare Wellness Visit. I appreciate your continued commitment to your health goals. Please review the care plan we discussed, and feel free to reach out if I can assist you further.  Please note that Annual Wellness Visits do not include a physical exam. Some assessments may be limited, especially if the visit was conducted virtually. If needed, we may recommend an in-person follow-up with your provider.  Ongoing Care Seeing your primary care provider every 3 to 6 months helps us  monitor your health and provide consistent, personalized care.   Referrals If a referral was made during today's visit and you haven't received any updates within two weeks, please contact the referred provider directly to check on the status.  Recommended Screenings:  Health Maintenance  Topic Date Due   Flu Shot  02/05/2025*   DTaP/Tdap/Td vaccine (1 - Tdap) 07/17/2025*   Cologuard (Stool DNA test)  08/11/2025   Medicare Annual Wellness Visit  09/26/2025   Breast Cancer Screening  12/05/2025   Osteoporosis screening with Bone Density Scan  Completed   Hepatitis C Screening  Completed   Meningitis B Vaccine  Aged Out   Pneumococcal Vaccine for age over 96  Discontinued   COVID-19 Vaccine  Discontinued   Zoster (Shingles) Vaccine  Discontinued  *Topic was postponed. The date shown is not the original due date.       09/26/2024    1:57 PM  Advanced Directives  Does Patient Have a Medical Advance Directive? Yes  Type of Estate Agent of [place];Living will  Does patient want to make changes to medical advance directive? Yes (Inpatient - patient requests chaplain consult to change a medical advance directive)  Copy of Healthcare Power of Attorney in Chart? No - copy requested    Vision: Annual vision screenings are recommended for early detection of glaucoma, cataracts, and diabetic retinopathy. These exams can also reveal signs  of chronic conditions such as diabetes and high blood pressure.  Dental: Annual dental screenings help detect early signs of oral cancer, gum disease, and other conditions linked to overall health, including heart disease and diabetes.

## 2024-10-16 DIAGNOSIS — K080 Exfoliation of teeth due to systemic causes: Secondary | ICD-10-CM | POA: Diagnosis not present

## 2024-10-18 ENCOUNTER — Ambulatory Visit: Admitting: Primary Care

## 2024-10-18 ENCOUNTER — Encounter: Payer: Self-pay | Admitting: Primary Care

## 2024-10-18 VITALS — BP 126/80 | HR 70 | Temp 97.9°F | Ht 64.25 in | Wt 149.1 lb

## 2024-10-18 DIAGNOSIS — E1165 Type 2 diabetes mellitus with hyperglycemia: Secondary | ICD-10-CM | POA: Diagnosis not present

## 2024-10-18 DIAGNOSIS — E785 Hyperlipidemia, unspecified: Secondary | ICD-10-CM

## 2024-10-18 LAB — MICROALBUMIN / CREATININE URINE RATIO
Creatinine,U: 69.4 mg/dL
Microalb Creat Ratio: UNDETERMINED mg/g (ref 0.0–30.0)
Microalb, Ur: <0.7 mg/dL

## 2024-10-18 LAB — LIPID PANEL
Cholesterol: 141 mg/dL (ref 0–200)
HDL: 38.7 mg/dL — ABNORMAL LOW (ref >39.00)
LDL Cholesterol: 73 mg/dL (ref 0–99)
NonHDL: 101.93
Total CHOL/HDL Ratio: 4
Triglycerides: 143 mg/dL (ref 0.0–149.0)
VLDL: 28.6 mg/dL (ref 0.0–40.0)

## 2024-10-18 LAB — POCT GLYCOSYLATED HEMOGLOBIN (HGB A1C): Hemoglobin A1C: 6 % — AB (ref 4.0–5.6)

## 2024-10-18 NOTE — Assessment & Plan Note (Signed)
 Improved and controlled with A1C today of 6.0!  Continue working on lifestyle changes. Foot exam today. Urine microalbumin pending.  Follow up in 6 months.

## 2024-10-18 NOTE — Progress Notes (Signed)
 Subjective:    Patient ID: Carolyn Cooke, female    DOB: 1956/09/18, 68 y.o.   MRN: [id number]  Carolyn Cooke is a very pleasant 68 y.o. female with a history of new onset type 2 diabetes, hyperlipidemia who presents today for follow-up of diabetes.  She is also due for repeat lipid panel since we increase the dose of her rosuvastatin  3 months ago.  1) Type 2 Diabetes:  Current medications include: None  She is checking her blood glucose 0 times daily.  Last A1C: 6.9 in September 2025: Last Eye Exam: Up-to-date Last Foot Exam: Due Pneumonia Vaccination: Never completed Urine Microalbumin: Due Statin: Rosuvastatin   Dietary changes since last visit: She has changed to diet soda, increased water intake, limiting sugar intake, increasing fruits.   Exercise: Walking    Review of Systems  Respiratory:  Negative for shortness of breath.   Cardiovascular:  Negative for chest pain.  Endocrine: Negative for polydipsia, polyphagia and polyuria.  Neurological:  Negative for dizziness and numbness.         Past Medical History:  Diagnosis Date   COVID-19 virus infection 10/22/2020   GERD (gastroesophageal reflux disease)    Hyperlipidemia    Insomnia    Prediabetes     Social History   Socioeconomic History   Marital status: Married    Spouse name: Not on file   Number of children: Not on file   Years of education: Not on file   Highest education level: Not on file  Occupational History   Not on file  Tobacco Use   Smoking status: Never   Smokeless tobacco: Never  Substance and Sexual Activity   Alcohol use: Not on file   Drug use: Not on file   Sexual activity: Yes  Other Topics Concern   Not on file  Social History Narrative   Married   Social Drivers of Health   Tobacco Use: Low Risk (10/18/2024)   Patient History    Smoking Tobacco Use: Never    Smokeless Tobacco Use: Never    Passive Exposure: Not on file  Financial Resource Strain: Not on  file  Food Insecurity: No Food Insecurity (09/26/2024)   Epic    Worried About Programme Researcher, Broadcasting/film/video in the Last Year: Never true    Ran Out of Food in the Last Year: Never true  Transportation Needs: No Transportation Needs (09/26/2024)   Epic    Lack of Transportation (Medical): No    Lack of Transportation (Non-Medical): No  Physical Activity: Insufficiently Active (09/26/2024)   Exercise Vital Sign    Days of Exercise per Week: 2 days    Minutes of Exercise per Session: 30 min  Stress: No Stress Concern Present (09/26/2024)   [employer] of Occupational Health - Occupational Stress Questionnaire    Feeling of Stress: Not at all  Social Connections: Moderately Isolated (09/26/2024)   Social Connection and Isolation Panel    Frequency of Communication with Friends and Family: More than three times a week    Frequency of Social Gatherings with Friends and Family: More than three times a week    Attends Religious Services: Never    Database Administrator or Organizations: No    Attends Banker Meetings: Never    Marital Status: Married  Catering Manager Violence: Not At Risk (09/26/2024)   Epic    Fear of Current or Ex-Partner: No    Emotionally Abused: No    Physically Abused:  No    Sexually Abused: No  Depression (PHQ2-9): Low Risk (10/18/2024)   Depression (PHQ2-9)    PHQ-2 Score: 1  Alcohol Screen: Not on file  Housing: Unknown (09/26/2024)   Epic    Unable to Pay for Housing in the Last Year: No    Number of Times Moved in the Last Year: Not on file    Homeless in the Last Year: No  Utilities: Not At Risk (09/26/2024)   Epic    Threatened with loss of utilities: No  Health Literacy: Adequate Health Literacy (09/26/2024)   B1300 Health Literacy    Frequency of need for help with medical instructions: Never    History reviewed. No pertinent surgical history.  Family History  Problem Relation Age of Onset   Arthritis Mother     Hypercholesterolemia Mother    Hypertension Mother    Hypertension Father     Allergies[1]  Medications Ordered Prior to Encounter[2]  BP 126/80   Pulse 70   Temp 97.9 F (36.6 C) (Oral)   Ht 5' 4.25 (1.632 m)   Wt 149 lb 2 oz (67.6 kg)   SpO2 98%   BMI 25.40 kg/m  Objective:   Physical Exam Cardiovascular:     Rate and Rhythm: Normal rate and regular rhythm.  Pulmonary:     Effort: Pulmonary effort is normal.     Breath sounds: Normal breath sounds.  Musculoskeletal:     Cervical back: Neck supple.  Skin:    General: Skin is warm and dry.  Neurological:     Mental Status: She is alert and oriented to person, place, and time.  Psychiatric:        Mood and Affect: Mood normal.     Physical Exam        Assessment & Plan:  Type 2 diabetes mellitus with hyperglycemia, without long-term current use of insulin (HCC) Assessment & Plan: Improved and controlled with A1C today of 6.0!  Continue working on lifestyle changes. Foot exam today. Urine microalbumin pending.  Follow up in 6 months.   Orders: -     POCT glycosylated hemoglobin (Hb A1C) -     Microalbumin / creatinine urine ratio  Hyperlipidemia, unspecified hyperlipidemia type -     Lipid panel    Assessment and Plan Assessment & Plan         Comer MARLA Gaskins, NP       [1]  Allergies Allergen Reactions   Cephalexin    Morphine    Prochlorperazine Other (See Comments)   Prochlorperazine Edisylate   [2]  Current Outpatient Medications on File Prior to Visit  Medication Sig Dispense Refill   FLUoxetine  (PROZAC ) 20 MG capsule Take 1 capsule (20 mg total) by mouth daily. for anxiety and depression. 90 capsule 3   melatonin 5 MG TABS Take 5 mg by mouth at bedtime.     omeprazole (PRILOSEC) 20 MG capsule Take 20 mg by mouth daily.     rosuvastatin  (CRESTOR ) 10 MG tablet Take 1 tablet (10 mg total) by mouth daily. for cholesterol. 90 tablet 3   traZODone  (DESYREL ) 50 MG tablet Take  50 mg by mouth at bedtime as needed.     No current facility-administered medications on file prior to visit.

## 2024-10-18 NOTE — Patient Instructions (Addendum)
 Stop by the lab prior to leaving today. I will notify you of your results once received.   Please schedule a follow up visit for 6 months for a diabetes check.  It was a pleasure to see you today!

## 2024-10-19 ENCOUNTER — Ambulatory Visit: Payer: Self-pay | Admitting: Primary Care

## 2025-04-18 ENCOUNTER — Ambulatory Visit: Admitting: Primary Care

## 2025-07-19 ENCOUNTER — Encounter: Admitting: Primary Care

## 2025-10-01 ENCOUNTER — Ambulatory Visit

## 2025-10-01 ENCOUNTER — Encounter: Admitting: Primary Care
